# Patient Record
Sex: Male | Born: 1970 | Race: Black or African American | Hispanic: No | Marital: Married | Smoking: Former smoker
Health system: Southern US, Community
[De-identification: ages and names within clinical notes are randomized; demographics above are authoritative.]

## PROBLEM LIST (undated history)

## (undated) DIAGNOSIS — G7000 Myasthenia gravis without (acute) exacerbation: Secondary | ICD-10-CM

## (undated) DIAGNOSIS — M1990 Unspecified osteoarthritis, unspecified site: Secondary | ICD-10-CM

## (undated) DIAGNOSIS — J45909 Unspecified asthma, uncomplicated: Secondary | ICD-10-CM

## (undated) DIAGNOSIS — Z87898 Personal history of other specified conditions: Secondary | ICD-10-CM

## (undated) DIAGNOSIS — I499 Cardiac arrhythmia, unspecified: Secondary | ICD-10-CM

## (undated) DIAGNOSIS — I4891 Unspecified atrial fibrillation: Secondary | ICD-10-CM

## (undated) DIAGNOSIS — R7303 Prediabetes: Secondary | ICD-10-CM

## (undated) DIAGNOSIS — E079 Disorder of thyroid, unspecified: Secondary | ICD-10-CM

## (undated) HISTORY — PX: ABLATION: SHX5711

## (undated) HISTORY — DX: Unspecified asthma, uncomplicated: J45.909

## (undated) HISTORY — DX: Unspecified atrial fibrillation: I48.91

## (undated) HISTORY — PX: TRACHEAL SURGERY: SHX1096

## (undated) HISTORY — PX: WISDOM TOOTH EXTRACTION: SHX21

## (undated) HISTORY — PX: A-FLUTTER ABLATION: EP1230

## (undated) HISTORY — DX: Disorder of thyroid, unspecified: E07.9

---

## 2010-05-07 HISTORY — PX: TRACHEAL SURGERY: SHX1096

## 2010-12-06 DIAGNOSIS — G7000 Myasthenia gravis without (acute) exacerbation: Secondary | ICD-10-CM

## 2010-12-06 HISTORY — DX: Myasthenia gravis without (acute) exacerbation: G70.00

## 2011-02-19 DIAGNOSIS — G7000 Myasthenia gravis without (acute) exacerbation: Secondary | ICD-10-CM

## 2011-02-19 DIAGNOSIS — E0500 Thyrotoxicosis with diffuse goiter without thyrotoxic crisis or storm: Secondary | ICD-10-CM | POA: Insufficient documentation

## 2011-02-20 DIAGNOSIS — IMO0001 Reserved for inherently not codable concepts without codable children: Secondary | ICD-10-CM | POA: Insufficient documentation

## 2011-02-20 DIAGNOSIS — IMO0002 Reserved for concepts with insufficient information to code with codable children: Secondary | ICD-10-CM | POA: Insufficient documentation

## 2011-03-23 DIAGNOSIS — I4891 Unspecified atrial fibrillation: Secondary | ICD-10-CM | POA: Insufficient documentation

## 2011-12-17 DIAGNOSIS — IMO0002 Reserved for concepts with insufficient information to code with codable children: Secondary | ICD-10-CM | POA: Insufficient documentation

## 2011-12-17 DIAGNOSIS — Z0000 Encounter for general adult medical examination without abnormal findings: Secondary | ICD-10-CM | POA: Insufficient documentation

## 2016-10-24 MED FILL — HYDROCODON-APAP 10-325: 10-325 | 30 days supply | Qty: 60 | Fill #0

## 2016-11-07 ENCOUNTER — Encounter (HOSPITAL_COMMUNITY): Payer: Self-pay | Admitting: Emergency Medicine

## 2016-11-07 ENCOUNTER — Emergency Department (HOSPITAL_COMMUNITY)
Admission: EM | Admit: 2016-11-07 | Discharge: 2016-11-07 | Disposition: A | Discharge status: Home / Self Care | Payer: No Typology Code available for payment source | Attending: Emergency Medicine | Admitting: Emergency Medicine

## 2016-11-07 ENCOUNTER — Emergency Department (HOSPITAL_COMMUNITY): Payer: No Typology Code available for payment source

## 2016-11-07 DIAGNOSIS — S39012A Strain of muscle, fascia and tendon of lower back, initial encounter: Secondary | ICD-10-CM

## 2016-11-07 DIAGNOSIS — Y939 Activity, unspecified: Secondary | ICD-10-CM | POA: Insufficient documentation

## 2016-11-07 DIAGNOSIS — Y998 Other external cause status: Secondary | ICD-10-CM | POA: Insufficient documentation

## 2016-11-07 DIAGNOSIS — Y92410 Unspecified street and highway as the place of occurrence of the external cause: Secondary | ICD-10-CM | POA: Insufficient documentation

## 2016-11-07 DIAGNOSIS — S3992XA Unspecified injury of lower back, initial encounter: Secondary | ICD-10-CM | POA: Diagnosis present

## 2016-11-07 HISTORY — DX: Myasthenia gravis without (acute) exacerbation: G70.00

## 2016-11-07 MED ORDER — KETOROLAC TROMETHAMINE 30 MG/ML IJ SOLN: 30.0000 mg | Freq: Once | INTRAMUSCULAR | Status: DC

## 2016-11-07 NOTE — Discharge Instructions (Addendum)
Take ibuprofen and tylenol as needed for pain. Your x-ray of the back does not show broken bone or serious injury.  You will be very sore over the next several days. Symptoms can last for 1-2 weeks.  Return for worsening symptoms, including difficulty walking, confusion, new numbness/weakness or any other symptoms concerning to you.
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
## 2019-02-07 ENCOUNTER — Encounter (HOSPITAL_COMMUNITY): Payer: Self-pay

## 2019-02-07 ENCOUNTER — Ambulatory Visit (INDEPENDENT_AMBULATORY_CARE_PROVIDER_SITE_OTHER): Payer: Medicaid Other

## 2019-02-07 ENCOUNTER — Ambulatory Visit (HOSPITAL_COMMUNITY)
Admission: EM | Admit: 2019-02-07 | Discharge: 2019-02-07 | Disposition: A | Discharge status: Home / Self Care | Payer: Medicaid Other | Attending: Emergency Medicine | Admitting: Emergency Medicine

## 2019-02-07 ENCOUNTER — Other Ambulatory Visit: Payer: Self-pay

## 2019-02-07 DIAGNOSIS — Z20822 Contact with and (suspected) exposure to covid-19: Secondary | ICD-10-CM

## 2019-02-07 DIAGNOSIS — J22 Unspecified acute lower respiratory infection: Secondary | ICD-10-CM | POA: Diagnosis present

## 2019-02-07 DIAGNOSIS — U071 COVID-19: Secondary | ICD-10-CM | POA: Diagnosis not present

## 2019-02-07 DIAGNOSIS — Z20828 Contact with and (suspected) exposure to other viral communicable diseases: Secondary | ICD-10-CM

## 2019-02-07 DIAGNOSIS — J111 Influenza due to unidentified influenza virus with other respiratory manifestations: Secondary | ICD-10-CM

## 2019-02-07 MED ORDER — ACETAMINOPHEN 325 MG PO TABS
650.0000 mg | ORAL_TABLET | Freq: Once | ORAL | Status: AC
  Administered 2019-02-07: 650 mg via ORAL

## 2019-02-07 NOTE — ED Triage Notes (Signed)
Pt cc body ache, fever, and a headache.pt states he wants to be tested for covid.

## 2019-02-07 NOTE — Discharge Instructions (Signed)
Your chest xray is normal today which is reassuring.  As you are both ill, this does appear consistent and concerning for covid-19.  Unfortunately there isn't a specific treatment for this.  Your oxygen level and breathing look well today, which is reassuring.  Continue as you have, rest, fluids, tylenol as needed for pain or fevers.  Self isolate until covid results are back and negative.  Will notify you of any positive findings. You may monitor your results on your MyChart online as well.    Any worsening- increased work of breathing, worsening of chest pain , or otherwise worsening please go to the ER.  You may want to reach out to your neurology team on Monday to get further recommendations as well, especially if symptoms are persisting.

## 2019-02-07 NOTE — ED Provider Notes (Signed)
[facility]    CSN: [id number] Arrival date & time: 02/07/19  1545      History   Chief Complaint Chief Complaint  Patient presents with  . Fever    HPI Russell Green is a 48 y.o. male.   Russell Green presents with complaints of fever/chills, headaches, no taste or smell, body aches, cough, congestion. No sore throat or ear pain. Nausea. No vomiting or diarrhea. Symptoms started 1 week ago. Worsening. Not working, wife doesn't work either. She is here today as well with same symptoms and also feeling worse. No specific known ill contacts. Chest pain  And shortness of breath . Denies any previous similar. History  Of myasthenia crisis and respiratory failure with this. He is on long term prednisone as well as mestinon. Has been taking tylenol, took at 3p this afternoon. Does feel a little better after this. Does follow with a PCP and well as with neurology. Doesn't smoke.     ROS per HPI, negative if not otherwise mentioned.      Past Medical History:  Diagnosis Date  . Myasthenia gravis ([place]) .    There are no active problems to display for this patient.   History reviewed. No pertinent surgical history.     Home Medications    Prior to Admission medications   Medication Sig Start Date End Date Taking? Authorizing Provider  omeprazole (PRILOSEC) 20 MG capsule Take 20 mg by mouth daily.   Yes [provider]  oxyCODONE (OXYCONTIN) 10 mg 12 hr tablet Take 10 mg by mouth every 12 (twelve) hours.   Yes [provider]  predniSONE (DELTASONE) 10 MG tablet Take 30 mg by mouth daily with breakfast.   Yes [provider]  pyridostigmine (MESTINON) 60 MG tablet Take 60 mg by mouth 3 (three) times daily.   Yes [provider]    Family History No family history on file.  Social History Social History   Tobacco Use  . Smoking status: Never Smoker  . Smokeless tobacco: Never Used  Substance Use Topics  . Alcohol use: No  .  Drug use: No     Allergies   Patient has no known allergies.   Review of Systems Review of Systems   Physical Exam Triage Vital Signs ED Triage Vitals  Enc Vitals Group     BP 02/07/19 1639 136/83     Pulse Rate 02/07/19 1639 100     Resp 02/07/19 1639 18     Temp 02/07/19 1639 (!) 101.8 F (38.8 C)     Temp Source 02/07/19 1639 Oral     SpO2 02/07/19 1639 97 %     Weight 02/07/19 1635 260 lb (117.9 kg)     Height --      Head Circumference --      Peak Flow --      Pain Score 02/07/19 1635 8     Pain Loc --      Pain Edu? --      Excl. in [place]? --    No data found.  Updated Vital Signs BP 136/83 (BP Location: Right Arm)   Pulse 100   Temp (!) 101.8 F (38.8 C) (Oral)   Resp 18   Wt 260 lb (117.9 kg)   SpO2 97%   BMI 34.30 kg/m    Physical Exam Constitutional:      General: He is not in acute distress.    Appearance: He is well-developed. He is ill-appearing.  Cardiovascular:     Rate and Rhythm: Normal rate and regular rhythm.  Pulmonary:     Effort: Pulmonary effort is normal. No respiratory distress.     Breath sounds: Normal breath sounds. No wheezing.  Skin:    General: Skin is warm and dry.  Neurological:     Mental Status: He is alert and oriented to person, place, and time.      UC Treatments / Results  Labs (all labs ordered are listed, but only abnormal results are displayed) Labs Reviewed  NOVEL CORONAVIRUS, NAA (HOSP ORDER, SEND-OUT TO REF LAB; TAT 18-24 HRS)    EKG   Radiology Dg Chest 2 View  Result Date: 02/07/2019 CLINICAL DATA:  Fever, cough EXAM: CHEST - 2 VIEW COMPARISON:  None. FINDINGS: Cardiomegaly. Both lungs are clear. Pectus deformity. The visualized skeletal structures are otherwise unremarkable. IMPRESSION: Cardiomegaly without acute abnormality of the lungs. Electronically Signed   By: Eddie Candle M.D.   On: 02/07/2019 17:20    Procedures Procedures (including critical care time)  Medications Ordered in UC  Medications  acetaminophen (TYLENOL) tablet 650 mg (650 mg Oral Given 02/07/19 1701)    Initial Impression / Assessment and Plan / UC Course  I have reviewed the triage vital signs and the nursing notes.  Pertinent labs & imaging results that were available during my care of the patient were reviewed by me and considered in my medical decision making (see chart for details).     Fever noted. No tachypnea or hypoxia. No increased work of breathing noted. Wife with same symptoms and same time frame, both with normal chest xrays. Suspicious for viral illness, likely covid-19 during this pandemic. covid testing pending. Supportive cares discussed and recommended as well as isolation precautions. Strict er precautions discussed and emphasized in this patient with myasthenia gravis. Patient verbalized understanding and agreeable to plan.  Ambulatory out of clinic without difficulty.    Final Clinical Impressions(s) / UC Diagnoses   Final diagnoses:  Lower respiratory tract infection  Suspected COVID-19 virus infection     Discharge Instructions     Your chest xray is normal today which is reassuring.  As you are both ill, this does appear consistent and concerning for covid-19.  Unfortunately there isn't a specific treatment for this.  Your oxygen level and breathing look well today, which is reassuring.  Continue as you have, rest, fluids, tylenol as needed for pain or fevers.  Self isolate until covid results are back and negative.  Will notify you of any positive findings. You may monitor your results on your MyChart online as well.    Any worsening- increased work of breathing, worsening of chest pain , or otherwise worsening please go to the ER.  You may want to reach out to your neurology team on Monday to get further recommendations as well, especially if symptoms are persisting.     ED Prescriptions    None     PDMP not reviewed this encounter.   Zigmund Gottron, NP  02/07/19 1752

## 2019-02-09 ENCOUNTER — Telehealth (HOSPITAL_COMMUNITY): Payer: Self-pay | Admitting: Emergency Medicine

## 2019-02-09 ENCOUNTER — Other Ambulatory Visit: Payer: Self-pay

## 2019-02-09 ENCOUNTER — Emergency Department (HOSPITAL_COMMUNITY): Payer: HRSA Program

## 2019-02-09 ENCOUNTER — Encounter (HOSPITAL_COMMUNITY): Payer: Self-pay | Admitting: *Deleted

## 2019-02-09 ENCOUNTER — Inpatient Hospital Stay (HOSPITAL_COMMUNITY)
Admission: EM | Admit: 2019-02-09 | Discharge: 2019-02-13 | DRG: 177 | Disposition: A | Discharge status: Home / Self Care | Payer: HRSA Program | Attending: Internal Medicine | Admitting: Internal Medicine

## 2019-02-09 DIAGNOSIS — J1289 Other viral pneumonia: Secondary | ICD-10-CM | POA: Diagnosis present

## 2019-02-09 DIAGNOSIS — E860 Dehydration: Secondary | ICD-10-CM | POA: Diagnosis present

## 2019-02-09 DIAGNOSIS — N179 Acute kidney failure, unspecified: Secondary | ICD-10-CM

## 2019-02-09 DIAGNOSIS — K219 Gastro-esophageal reflux disease without esophagitis: Secondary | ICD-10-CM | POA: Diagnosis present

## 2019-02-09 DIAGNOSIS — U071 COVID-19: Secondary | ICD-10-CM | POA: Diagnosis present

## 2019-02-09 DIAGNOSIS — D696 Thrombocytopenia, unspecified: Secondary | ICD-10-CM | POA: Diagnosis present

## 2019-02-09 DIAGNOSIS — R5383 Other fatigue: Secondary | ICD-10-CM | POA: Diagnosis present

## 2019-02-09 DIAGNOSIS — G7000 Myasthenia gravis without (acute) exacerbation: Secondary | ICD-10-CM

## 2019-02-09 DIAGNOSIS — E669 Obesity, unspecified: Secondary | ICD-10-CM | POA: Diagnosis present

## 2019-02-09 DIAGNOSIS — Z6834 Body mass index (BMI) 34.0-34.9, adult: Secondary | ICD-10-CM | POA: Diagnosis not present

## 2019-02-09 DIAGNOSIS — D72819 Decreased white blood cell count, unspecified: Secondary | ICD-10-CM | POA: Diagnosis present

## 2019-02-09 LAB — CBC WITH DIFFERENTIAL/PLATELET
Abs Immature Granulocytes: 0.01 10*3/uL (ref 0.00–0.07)
Basophils Absolute: 0 10*3/uL (ref 0.0–0.1)
Basophils Relative: 0 %
Eosinophils Absolute: 0 10*3/uL (ref 0.0–0.5)
Eosinophils Relative: 0 %
HCT: 46.4 % (ref 39.0–52.0)
Hemoglobin: 15.4 g/dL (ref 13.0–17.0)
Immature Granulocytes: 0 %
Lymphocytes Relative: 22 %
Lymphs Abs: 0.8 10*3/uL (ref 0.7–4.0)
MCH: 30 pg (ref 26.0–34.0)
MCHC: 33.2 g/dL (ref 30.0–36.0)
MCV: 90.4 fL (ref 80.0–100.0)
Monocytes Absolute: 0.2 10*3/uL (ref 0.1–1.0)
Monocytes Relative: 4 %
Neutro Abs: 2.8 10*3/uL (ref 1.7–7.7)
Neutrophils Relative %: 74 %
Platelets: 126 10*3/uL — ABNORMAL LOW (ref 150–400)
RBC: 5.13 MIL/uL (ref 4.22–5.81)
RDW: 12.9 % (ref 11.5–15.5)
WBC: 3.8 10*3/uL — ABNORMAL LOW (ref 4.0–10.5)
nRBC: 0 % (ref 0.0–0.2)

## 2019-02-09 LAB — COMPREHENSIVE METABOLIC PANEL
ALT: 22 U/L (ref 0–44)
AST: 31 U/L (ref 15–41)
Albumin: 3.5 g/dL (ref 3.5–5.0)
Alkaline Phosphatase: 37 U/L — ABNORMAL LOW (ref 38–126)
Anion gap: 11 (ref 5–15)
BUN: 15 mg/dL (ref 6–20)
CO2: 28 mmol/L (ref 22–32)
Calcium: 8.2 mg/dL — ABNORMAL LOW (ref 8.9–10.3)
Chloride: 95 mmol/L — ABNORMAL LOW (ref 98–111)
Creatinine, Ser: 1.6 mg/dL — ABNORMAL HIGH (ref 0.61–1.24)
GFR calc Af Amer: 59 mL/min — ABNORMAL LOW (ref >60)
GFR calc non Af Amer: 51 mL/min — ABNORMAL LOW (ref >60)
Glucose, Bld: 97 mg/dL (ref 70–99)
Potassium: 3.8 mmol/L (ref 3.5–5.1)
Sodium: 134 mmol/L — ABNORMAL LOW (ref 135–145)
Total Bilirubin: 1 mg/dL (ref 0.3–1.2)
Total Protein: 6.4 g/dL — ABNORMAL LOW (ref 6.5–8.1)

## 2019-02-09 LAB — URINALYSIS, ROUTINE W REFLEX MICROSCOPIC
Bilirubin Urine: NEGATIVE
Glucose, UA: NEGATIVE mg/dL
Hgb urine dipstick: NEGATIVE
Ketones, ur: 5 mg/dL — AB
Leukocytes,Ua: NEGATIVE
Nitrite: NEGATIVE
Protein, ur: NEGATIVE mg/dL
Specific Gravity, Urine: 1.021 (ref 1.005–1.030)
pH: 6 (ref 5.0–8.0)

## 2019-02-09 LAB — C-REACTIVE PROTEIN: CRP: 9.7 mg/dL — ABNORMAL HIGH (ref <1.0)

## 2019-02-09 LAB — PROTIME-INR
INR: 1.1 (ref 0.8–1.2)
Prothrombin Time: 14.4 seconds (ref 11.4–15.2)

## 2019-02-09 LAB — LACTIC ACID, PLASMA
Lactic Acid, Venous: 1.1 mmol/L (ref 0.5–1.9)
Lactic Acid, Venous: 1.6 mmol/L (ref 0.5–1.9)

## 2019-02-09 LAB — FERRITIN: Ferritin: 337 ng/mL — ABNORMAL HIGH (ref 24–336)

## 2019-02-09 LAB — PROCALCITONIN: Procalcitonin: <0.1 ng/mL

## 2019-02-09 MED ORDER — METHYLPREDNISOLONE SODIUM SUCC 125 MG IJ SOLR
60.0000 mg | Freq: Two times a day (BID) | INTRAMUSCULAR | Status: DC
  Administered 2019-02-10 – 2019-02-13 (×8): 60 mg via INTRAVENOUS
  Filled 2019-02-09 (×8): qty 2

## 2019-02-09 MED ORDER — ENOXAPARIN SODIUM 60 MG/0.6ML ~~LOC~~ SOLN
60.0000 mg | Freq: Every day | SUBCUTANEOUS | Status: DC
  Administered 2019-02-10 – 2019-02-12 (×4): 60 mg via SUBCUTANEOUS
  Filled 2019-02-09 (×4): qty 0.6

## 2019-02-09 MED ORDER — SODIUM CHLORIDE 0.9 % IV BOLUS
1000.0000 mL | Freq: Once | INTRAVENOUS | Status: AC
  Administered 2019-02-09: 18:00:00 1000 mL via INTRAVENOUS

## 2019-02-09 MED ORDER — PANTOPRAZOLE SODIUM 40 MG PO TBEC
40.0000 mg | DELAYED_RELEASE_TABLET | Freq: Every day | ORAL | Status: DC
  Administered 2019-02-10 – 2019-02-13 (×5): 40 mg via ORAL
  Filled 2019-02-09 (×5): qty 1

## 2019-02-09 MED ORDER — SODIUM CHLORIDE 0.9 % IV SOLN
100.0000 mg | INTRAVENOUS | Status: AC
  Administered 2019-02-10 – 2019-02-13 (×4): 100 mg via INTRAVENOUS
  Filled 2019-02-09 (×4): qty 20

## 2019-02-09 MED ORDER — HEPARIN SODIUM (PORCINE) 5000 UNIT/ML IJ SOLN: 5000.0000 [IU] | Freq: Three times a day (TID) | INTRAMUSCULAR | Status: DC

## 2019-02-09 MED ORDER — SODIUM CHLORIDE 0.9 % IV SOLN
200.0000 mg | Freq: Once | INTRAVENOUS | Status: AC
  Administered 2019-02-10: 01:00:00 200 mg via INTRAVENOUS
  Filled 2019-02-09: qty 40

## 2019-02-09 MED ORDER — ACETAMINOPHEN 325 MG PO TABS
650.0000 mg | ORAL_TABLET | Freq: Four times a day (QID) | ORAL | Status: DC | PRN
  Administered 2019-02-11: 650 mg via ORAL
  Filled 2019-02-09: qty 2

## 2019-02-09 MED ORDER — ACETAMINOPHEN 500 MG PO TABS
1000.0000 mg | ORAL_TABLET | Freq: Once | ORAL | Status: AC
  Administered 2019-02-09: 18:00:00 1000 mg via ORAL
  Filled 2019-02-09: qty 2

## 2019-02-09 MED ORDER — PYRIDOSTIGMINE BROMIDE 60 MG PO TABS
60.0000 mg | ORAL_TABLET | Freq: Three times a day (TID) | ORAL | Status: DC
  Administered 2019-02-10 – 2019-02-13 (×11): 60 mg via ORAL
  Filled 2019-02-09 (×15): qty 1

## 2019-02-09 NOTE — H&P (Signed)
History and Physical    Ah Bott TKZ:[id number] DOB: 1970/06/06 DOA: 02/09/2019  PCP: Rogers Blocker, MD   Patient coming from: Home    Chief Complaint: Fever, body aches, headache  HPI: Russell Green is a 48 y.o. male with medical history significant of myasthenia gravis who presented to the emergency department today after he knew that he was tested positive for COVID and was also having high-grade fever, severe body ache and headache at home. Patient lives with his wife.  He did not give any history of travel or sick contacts.  About a week ago he started having myalgia, headache fever.  He lost his appetite.  He came to the emergency department in October 3 and was tested for COVID .  He was sent home from the ED.  Today he was called that he was tested positive for COVID.  Since his fever was not going anywhere, and he was very weak he decided come to the emergency department today. Patient seen and examined the bedside in the emergency department.  He still had high-grade fever.  He was saturating 93-94% on room air.  Denies any chest pain, shortness of breath, palpitation, nausea, vomiting, abdominal pain.  He reported severe loss of appetite. Patient has history of myasthenia gravis and follows with his PCP.  He is on Mestinon and prednisone and he says his myasthenia gravis is particularly well controlled.  ED Course: Chest x-ray showed subtle opacities within the LEFT lower lung and streaky opacities at the RIGHT lung base, suspicious for multifocal pneumonia.  He was given a bolus of normal saline.  Blood cultures have been sent.  Started on Solu-Medrol and put pharmacy consult to start Remdesivir.    Review of Systems: As per HPI otherwise 10 point review of systems negative.    Past Medical History:  Diagnosis Date   Myasthenia gravis ([place]) .    History reviewed. No pertinent surgical history.   reports that he has never smoked. He has never used smokeless tobacco. He  reports that he does not drink alcohol or use drugs.  No Known Allergies  No family history on file.   Prior to Admission medications   Medication Sig Start Date End Date Taking? Authorizing Provider  acetaminophen (TYLENOL) 325 MG tablet Take 650 mg by mouth every 6 (six) hours as needed for mild pain, fever or headache.   Yes [provider]  omeprazole (PRILOSEC) 20 MG capsule Take 20 mg by mouth daily.   Yes [provider]  Oxycodone HCl 10 MG TABS Take 10 mg by mouth 2 (two) times daily.   Yes [provider]  predniSONE (DELTASONE) 10 MG tablet Take 30 mg by mouth daily with breakfast.   Yes [provider]  pyridostigmine (MESTINON) 60 MG tablet Take 60 mg by mouth 3 (three) times daily.   Yes [provider]    Physical Exam: Vitals:   02/09/19 1656 02/09/19 1657 02/09/19 1701 02/09/19 1742  BP: 114/72 114/72  112/67  Pulse:  97  (!) 104  Resp: (!) 21 16  (!) 27  Temp:  (!) 102.4 F (39.1 C) (!) 103.5 F (39.7 C)   TempSrc:  Oral Rectal   SpO2:  93%  95%  Weight:   117.9 kg   Height:   6' 1"  (1.854 m)     Constitutional: Generalized weakness, ill looking Vitals:   02/09/19 1656 02/09/19 1657 02/09/19 1701 02/09/19 1742  BP: 114/72 114/72  112/67  Pulse:  97  (!) 104  Resp: (!) 21 16  (!) 27  Temp:  (!) 102.4 F (39.1 C) (!) 103.5 F (39.7 C)   TempSrc:  Oral Rectal   SpO2:  93%  95%  Weight:   117.9 kg   Height:   6' 1"  (1.854 m)    Respiratory: clear to auscultation bilaterally, no wheezing, no crackles. Normal respiratory effort. No accessory muscle use.  Cardiovascular: Regular rate and rhythm, no murmurs / rubs / gallops. No extremity edema.  Abdomen: no tenderness, soft,ND.   Skin: tatoos,no rashes, lesions, ulcers. No induration   Foley Catheter:None  Labs on Admission: I have personally reviewed following labs and imaging studies  CBC: Recent Labs  Lab 02/09/19 1549  WBC 3.8*  NEUTROABS 2.8  HGB  15.4  HCT 46.4  MCV 90.4  PLT 914*   Basic Metabolic Panel: Recent Labs  Lab 02/09/19 1549  NA 134*  K 3.8  CL 95*  CO2 28  GLUCOSE 97  BUN 15  CREATININE 1.60*  CALCIUM 8.2*   GFR: Estimated Creatinine Clearance: 76.8 mL/min (A) (by C-G formula based on SCr of 1.6 mg/dL (H)). Liver Function Tests: Recent Labs  Lab 02/09/19 1549  AST 31  ALT 22  ALKPHOS 37*  BILITOT 1.0  PROT 6.4*  ALBUMIN 3.5   No results for input(s): LIPASE, AMYLASE in the last 168 hours. No results for input(s): AMMONIA in the last 168 hours. Coagulation Profile: Recent Labs  Lab 02/09/19 1549  INR 1.1   Cardiac Enzymes: No results for input(s): CKTOTAL, CKMB, CKMBINDEX, TROPONINI in the last 168 hours. BNP (last 3 results) No results for input(s): PROBNP in the last 8760 hours. HbA1C: No results for input(s): HGBA1C in the last 72 hours. CBG: No results for input(s): GLUCAP in the last 168 hours. Lipid Profile: No results for input(s): CHOL, HDL, LDLCALC, TRIG, CHOLHDL, LDLDIRECT in the last 72 hours. Thyroid Function Tests: No results for input(s): TSH, T4TOTAL, FREET4, T3FREE, THYROIDAB in the last 72 hours. Anemia Panel: No results for input(s): VITAMINB12, FOLATE, FERRITIN, TIBC, IRON, RETICCTPCT in the last 72 hours. Urine analysis:    Component Value Date/Time   COLORURINE YELLOW 02/09/2019 1656   APPEARANCEUR CLEAR 02/09/2019 1656   LABSPEC 1.021 02/09/2019 1656   PHURINE 6.0 02/09/2019 1656   GLUCOSEU NEGATIVE 02/09/2019 1656   HGBUR NEGATIVE 02/09/2019 1656   BILIRUBINUR NEGATIVE 02/09/2019 1656   KETONESUR 5 (A) 02/09/2019 1656   PROTEINUR NEGATIVE 02/09/2019 1656   NITRITE NEGATIVE 02/09/2019 1656   LEUKOCYTESUR NEGATIVE 02/09/2019 1656    Radiological Exams on Admission: Dg Chest Port 1 View  Result Date: 02/09/2019 CLINICAL DATA:  Shortness of breath. COVID-19 positive last Friday. Febrile. EXAM: PORTABLE CHEST 1 VIEW COMPARISON:  Chest x-rays dated 02/07/2019.  FINDINGS: Stable cardiomegaly. Subtle opacities within the LEFT lower lung and streaky opacities at the RIGHT lung base, suspicious for multifocal pneumonia. No pleural effusion or pneumothorax seen. Osseous structures about the chest are unremarkable. IMPRESSION: 1. Subtle opacities within the LEFT lower lung and streaky opacities at the RIGHT lung base, suspicious for multifocal pneumonia. 2. Stable cardiomegaly. Electronically Signed   By: Franki Cabot M.D.   On: 02/09/2019 17:06     Assessment/Plan Principal Problem:   COVID-19 Active Problems:   AKI (acute kidney injury) ([place])   MG (myasthenia gravis) ([place])   COVID-19 pneumonia: Presented with fever, myalgia, headache.  Saturating fine on room air.  Chest x-ray showed multifocal pneumonia. We  will start on Solu-Medrol and remdesivir. Will consider giving him  Actemra if he becomes hypoxic.  Will check procalcitonin, d-dimer, ferritin, CRP. Tylenol for fever and headache Patient will be transferred to [facility].  AKI: Creatinine normal as per report on 06/09/2018.  Most likely this is from dehydration.  He was given a liter of normal saline in the emergency department.  I will hold on starting on scheduled IV fluids.  Will check BMP tomorrow.  Myasthenia gravis: On Mestinon and prednisone at home.  Currently stable.  Will resume Mestinon  Leucopenia/Thrombocytopenia:Mild.Likely associated with acute viral illness. WBC/Platelets were normal on reviewing previous labs. We will continue to monitor.  Severity of Illness: The appropriate patient status for this patient is INPATIENT.   DVT prophylaxis: Lovenox Code Status: Full code Family Communication: Called wife twice,phone not received Consults called: None     Shelly Coss MD [facility] Pager [phone]  If 7PM-7AM, please contact night-coverage [email or web address] Password [email or web address]  02/09/2019, 6:18 PM

## 2019-02-09 NOTE — ED Notes (Signed)
Phil ([facility]/Transport to [facility] ([place] called @ 1820-per Lonn Georgia, RN called by Levada Dy

## 2019-02-09 NOTE — ED Provider Notes (Signed)
[facility] EMERGENCY DEPARTMENT Provider Note   CSN: [id number] Arrival date & time: 02/09/19  1450     History   Chief Complaint Chief Complaint  Patient presents with  . Fatigue    covid +    HPI Russell Green is a 48 y.o. male.     48 year old male with prior medical history as detailed below presents for evaluation of COVID symptoms.  Patient reports that he was diagnosed with COVID based on testing on the October 3.  Patient reports that he has been symptomatic for the last 5 to 7 days.  Symptoms include fever, chills, cough, congestion, malaise, and fatigue.  He presents today with complaint of continued symptoms and worsening fatigue.  Of note, patient has a prior history of myasthenia gravis.  He is currently on Mestinon and prednisone.  He reports a prior history of respiratory failure requiring trach.  He currently reports that his myasthenia is under good control.  The history is provided by the patient and medical records.  Illness Location:  Covid positive Onset quality:  Gradual Duration:  1 week Timing:  Constant Progression:  Worsening Chronicity:  New Associated symptoms: fatigue and fever     Past Medical History:  Diagnosis Date  . Myasthenia gravis ([place]) .    There are no active problems to display for this patient.   History reviewed. No pertinent surgical history.      Home Medications    Prior to Admission medications   Medication Sig Start Date End Date Taking? Authorizing Provider  acetaminophen (TYLENOL) 325 MG tablet Take 650 mg by mouth every 6 (six) hours as needed for mild pain, fever or headache.   Yes [provider]  omeprazole (PRILOSEC) 20 MG capsule Take 20 mg by mouth daily.   Yes [provider]  Oxycodone HCl 10 MG TABS Take 10 mg by mouth 2 (two) times daily.   Yes [provider]  predniSONE (DELTASONE) 10 MG tablet Take 30 mg by mouth daily with breakfast.   Yes [provider]  pyridostigmine (MESTINON) 60 MG tablet Take 60 mg by mouth 3 (three) times daily.   Yes [provider]    Family History No family history on file.  Social History Social History   Tobacco Use  . Smoking status: Never Smoker  . Smokeless tobacco: Never Used  Substance Use Topics  . Alcohol use: No  . Drug use: No     Allergies   Patient has no known allergies.   Review of Systems Review of Systems  Constitutional: Positive for fatigue and fever.  All other systems reviewed and are negative.    Physical Exam Updated Vital Signs BP 123/72 (BP Location: Right Arm)   Pulse (!) 118   Temp (!) 102.5 F (39.2 C) (Oral)   Resp 20   SpO2 97%   Physical Exam Vitals signs and nursing note reviewed.  Constitutional:      General: He is not in acute distress.    Appearance: He is well-developed.     Comments: Ill appearing   HENT:     Head: Normocephalic and atraumatic.  Eyes:     Conjunctiva/sclera: Conjunctivae normal.     Pupils: Pupils are equal, round, and reactive to light.  Neck:     Musculoskeletal: Normal range of motion and neck supple.  Cardiovascular:     Rate and Rhythm: Normal rate and regular rhythm.     Heart sounds: Normal heart  sounds.  Pulmonary:     Effort: Pulmonary effort is normal. No respiratory distress.     Breath sounds: Normal breath sounds.  Abdominal:     General: There is no distension.     Palpations: Abdomen is soft.     Tenderness: There is no abdominal tenderness.  Musculoskeletal: Normal range of motion.        General: No deformity.  Skin:    General: Skin is warm and dry.  Neurological:     Mental Status: He is alert and oriented to person, place, and time.      ED Treatments / Results  Labs (all labs ordered are listed, but only abnormal results are displayed) Labs Reviewed  COMPREHENSIVE METABOLIC PANEL - Abnormal; Notable for the following components:      Result Value   Sodium 134 (*)     Chloride 95 (*)    Creatinine, Ser 1.60 (*)    Calcium 8.2 (*)    Total Protein 6.4 (*)    Alkaline Phosphatase 37 (*)    GFR calc non Af Amer 51 (*)    GFR calc Af Amer 59 (*)    All other components within normal limits  CBC WITH DIFFERENTIAL/PLATELET - Abnormal; Notable for the following components:   WBC 3.8 (*)    Platelets 126 (*)    All other components within normal limits  URINALYSIS, ROUTINE W REFLEX MICROSCOPIC - Abnormal; Notable for the following components:   Ketones, ur 5 (*)    All other components within normal limits  CULTURE, BLOOD (ROUTINE X 2)  CULTURE, BLOOD (ROUTINE X 2)  LACTIC ACID, PLASMA  PROTIME-INR  LACTIC ACID, PLASMA  HIV ANTIBODY (ROUTINE TESTING W REFLEX)  HIV4GL SAVE TUBE  CBC  COMPREHENSIVE METABOLIC PANEL    EKG None  Radiology Dg Chest Port 1 View  Result Date: 02/09/2019 CLINICAL DATA:  Shortness of breath. COVID-19 positive last Friday. Febrile. EXAM: PORTABLE CHEST 1 VIEW COMPARISON:  Chest x-rays dated 02/07/2019. FINDINGS: Stable cardiomegaly. Subtle opacities within the LEFT lower lung and streaky opacities at the RIGHT lung base, suspicious for multifocal pneumonia. No pleural effusion or pneumothorax seen. Osseous structures about the chest are unremarkable. IMPRESSION: 1. Subtle opacities within the LEFT lower lung and streaky opacities at the RIGHT lung base, suspicious for multifocal pneumonia. 2. Stable cardiomegaly. Electronically Signed   By: Franki Cabot M.D.   On: 02/09/2019 17:06    Procedures Procedures (including critical care time)  Medications Ordered in ED Medications  sodium chloride 0.9 % bolus 1,000 mL (has no administration in time range)  acetaminophen (TYLENOL) tablet 1,000 mg (has no administration in time range)     Initial Impression / Assessment and Plan / ED Course  I have reviewed the triage vital signs and the nursing notes.  Pertinent labs & imaging results that were available during my care  of the patient were reviewed by me and considered in my medical decision making (see chart for details).        MDM  Screen complete  Russell Green was evaluated in Emergency Department on 02/09/2019 for the symptoms described in the history of present illness. He was evaluated in the context of the global COVID-19 pandemic, which necessitated consideration that the patient might be at risk for infection with the SARS-CoV-2 virus that causes COVID-19. Institutional protocols and algorithms that pertain to the evaluation of patients at risk for COVID-19 are in a state of rapid change based on information released  by regulatory bodies including the CDC and federal and state organizations. These policies and algorithms were followed during the patient's care in the ED.   Patient presents for evaluation of worsening symptoms secondary to COVID 19.   Patient would benefit from admission - he meets criteria for Remdesivir and steroids.  Hospitalist service is aware of case and will evaluate for admission.       Final Clinical Impressions(s) / ED Diagnoses   Final diagnoses:  [id number]    ED Discharge Orders    None       Valarie Merino, MD 02/09/19 [phone]

## 2019-02-09 NOTE — ED Notes (Signed)
Dinner tray delivered.

## 2019-02-09 NOTE — ED Notes (Signed)
Dinner Tray Ordered @ [phone].

## 2019-02-09 NOTE — ED Triage Notes (Signed)
Pt states he has had covid x 5 days.  States he feels terrible and so weak he cannot eat or drink.  Took "fever reducer" earlier today.  Temp 102.5.

## 2019-02-09 NOTE — Progress Notes (Signed)
Pharmacy Brief Note   O:  ALT: 22  CXR: Subtle opacities within the LEFT lower lung and streaky opacities at the RIGHT lung base, suspicious for multifocal pneumonia.  SpO2: 91% on room air    A/P:  Patient meets requirements for remdesivir therapy .  Will start  remdesivir 200 mg IV x 1  followed by 100 mg IV daily x 4 days.  Monitor ALT  Royetta Asal, PharmD, BCPS 02/09/2019 11:32 PM

## 2019-02-09 NOTE — Telephone Encounter (Signed)
Called patient number, wife answered and stated he is getting worse and very sick. Pt wife instructed to take him to the ER right away for evaluation. Called ER to notify them.

## 2019-02-09 NOTE — ED Notes (Signed)
ED TO INPATIENT HANDOFF REPORT  ED Nurse Name and Phone #:  Lonn Georgia [phone]  S Name/Age/Gender Russell Green 48 y.o. male Room/Bed: 041C/041C  Code Status   Code Status: Full Code  Home/SNF/Other Home Patient oriented to: self, place, time and situation Is this baseline? Yes   Triage Complete: Triage complete  Chief Complaint Covid positive, fatigue, symptoms  Triage Note Pt states he has had covid x 5 days.  States he feels terrible and so weak he cannot eat or drink.  Took "fever reducer" earlier today.  Temp 102.5.   Allergies No Known Allergies  Level of Care/Admitting Diagnosis ED Disposition    ED Disposition Condition [facility] Area: [place] [[id number]]  Level of Care: Med-Surg [16]  Covid Evaluation: Confirmed COVID Positive  Diagnosis: COVID-19 [[id number]]  Admitting Physician: Shelly Coss [[id number]]  Attending Physician: Shelly Coss [[id number]]  Estimated length of stay: past midnight tomorrow  Certification:: I certify this patient will need inpatient services for at least 2 midnights  PT Class (Do Not Modify): Inpatient [101]  PT Acc Code (Do Not Modify): Private [1]       B Medical/Surgery History Past Medical History:  Diagnosis Date  . Myasthenia gravis ([place]) .   History reviewed. No pertinent surgical history.   A IV Location/Drains/Wounds Patient Lines/Drains/Airways Status   Active Line/Drains/Airways    Name:   Placement date:   Placement time:   Site:   Days:   Peripheral IV 02/09/19 Left Forearm   02/09/19    1740    Forearm   less than 1          Intake/Output Last 24 hours No intake or output data in the 24 hours ending 02/09/19 2205  Labs/Imaging Results for orders placed or performed during the hospital encounter of 02/09/19 (from the past 48 hour(s))  Comprehensive metabolic panel     Status: Abnormal   Collection Time: 02/09/19  3:49 PM  Result Value Ref Range   Sodium 134 (L) 135  - 145 mmol/L   Potassium 3.8 3.5 - 5.1 mmol/L   Chloride 95 (L) 98 - 111 mmol/L   CO2 28 22 - 32 mmol/L   Glucose, Bld 97 70 - 99 mg/dL   BUN 15 6 - 20 mg/dL   Creatinine, Ser 1.60 (H) 0.61 - 1.24 mg/dL   Calcium 8.2 (L) 8.9 - 10.3 mg/dL   Total Protein 6.4 (L) 6.5 - 8.1 g/dL   Albumin 3.5 3.5 - 5.0 g/dL   AST 31 15 - 41 U/L   ALT 22 0 - 44 U/L   Alkaline Phosphatase 37 (L) 38 - 126 U/L   Total Bilirubin 1.0 0.3 - 1.2 mg/dL   GFR calc non Af Amer 51 (L) >60 mL/min   GFR calc Af Amer 59 (L) >60 mL/min   Anion gap 11 5 - 15    Comment: Performed at [facility], [address], [place], [place] [address]  Lactic acid, plasma     Status: None   Collection Time: 02/09/19  3:49 PM  Result Value Ref Range   Lactic Acid, Venous 1.1 0.5 - 1.9 mmol/L    Comment: Performed at [facility] [address], [place], [place] [address]  CBC with Differential     Status: Abnormal   Collection Time: 02/09/19  3:49 PM  Result Value Ref Range   WBC 3.8 (L) 4.0 - 10.5 K/uL   RBC  5.13 4.22 - 5.81 MIL/uL   Hemoglobin 15.4 13.0 - 17.0 g/dL   HCT 46.4 39.0 - 52.0 %   MCV 90.4 80.0 - 100.0 fL   MCH 30.0 26.0 - 34.0 pg   MCHC 33.2 30.0 - 36.0 g/dL   RDW 12.9 11.5 - 15.5 %   Platelets 126 (L) 150 - 400 K/uL    Comment: REPEATED TO VERIFY   nRBC 0.0 0.0 - 0.2 %   Neutrophils Relative % 74 %   Neutro Abs 2.8 1.7 - 7.7 K/uL   Lymphocytes Relative 22 %   Lymphs Abs 0.8 0.7 - 4.0 K/uL   Monocytes Relative 4 %   Monocytes Absolute 0.2 0.1 - 1.0 K/uL   Eosinophils Relative 0 %   Eosinophils Absolute 0.0 0.0 - 0.5 K/uL   Basophils Relative 0 %   Basophils Absolute 0.0 0.0 - 0.1 K/uL   Immature Granulocytes 0 %   Abs Immature Granulocytes 0.01 0.00 - 0.07 K/uL    Comment: Performed at [facility] [address], [place], [place] [address]  Protime-INR     Status: None   Collection Time: 02/09/19  3:49 PM  Result Value Ref Range   Prothrombin Time 14.4 11.4 - 15.2 seconds   INR  1.1 0.8 - 1.2    Comment: (NOTE) INR goal varies based on device and disease states. Performed at [facility], [place] [address] [place], [place] [address]   Urinalysis, Routine w reflex microscopic     Status: Abnormal   Collection Time: 02/09/19  4:56 PM  Result Value Ref Range   Color, Urine YELLOW YELLOW   APPearance CLEAR CLEAR   Specific Gravity, Urine 1.021 1.005 - 1.030   pH 6.0 5.0 - 8.0   Glucose, UA NEGATIVE NEGATIVE mg/dL   Hgb urine dipstick NEGATIVE NEGATIVE   Bilirubin Urine NEGATIVE NEGATIVE   Ketones, ur 5 (A) NEGATIVE mg/dL   Protein, ur NEGATIVE NEGATIVE mg/dL   Nitrite NEGATIVE NEGATIVE   Leukocytes,Ua NEGATIVE NEGATIVE    Comment: Performed at [facility] [address], [place], [place] [address]  Lactic acid, plasma     Status: None   Collection Time: 02/09/19  6:20 PM  Result Value Ref Range   Lactic Acid, Venous 1.6 0.5 - 1.9 mmol/L    Comment: Performed at [facility] [address], [place], [place] [address]  C-reactive protein     Status: Abnormal   Collection Time: 02/09/19  6:20 PM  Result Value Ref Range   CRP 9.7 (H) <1.0 mg/dL    Comment: Performed at [facility] [address], [place], [place] [address]  Ferritin     Status: Abnormal   Collection Time: 02/09/19  6:20 PM  Result Value Ref Range   Ferritin 337 (H) 24 - 336 ng/mL    Comment: Performed at [facility] [address] [place], [place] [address]  Procalcitonin - Baseline     Status: None   Collection Time: 02/09/19  6:20 PM  Result Value Ref Range   Procalcitonin <0.10 ng/mL    Comment:        Interpretation: PCT (Procalcitonin) <= 0.5 ng/mL: Systemic infection (sepsis) is not likely. Local bacterial infection is possible. (NOTE)       Sepsis PCT Algorithm           Lower Respiratory Tract  Infection PCT Algorithm    ----------------------------     ----------------------------         PCT < 0.25  ng/mL                PCT < 0.10 ng/mL         Strongly encourage             Strongly discourage   discontinuation of antibiotics    initiation of antibiotics    ----------------------------     -----------------------------       PCT 0.25 - 0.50 ng/mL            PCT 0.10 - 0.25 ng/mL               OR       >80% decrease in PCT            Discourage initiation of                                            antibiotics      Encourage discontinuation           of antibiotics    ----------------------------     -----------------------------         PCT >= 0.50 ng/mL              PCT 0.26 - 0.50 ng/mL               AND        <80% decrease in PCT             Encourage initiation of                                             antibiotics       Encourage continuation           of antibiotics    ----------------------------     -----------------------------        PCT >= 0.50 ng/mL                  PCT > 0.50 ng/mL               AND         increase in PCT                  Strongly encourage                                      initiation of antibiotics    Strongly encourage escalation           of antibiotics                                     -----------------------------                                           PCT <= 0.25 ng/mL  OR                                        > 80% decrease in PCT                                     Discontinue / Do not initiate                                             antibiotics Performed at [facility], [place] [address], [place], [place] [address]    Dg Chest Port 1 View  Result Date: 02/09/2019 CLINICAL DATA:  Shortness of breath. COVID-19 positive last Friday. Febrile. EXAM: PORTABLE CHEST 1 VIEW COMPARISON:  Chest x-rays dated 02/07/2019. FINDINGS: Stable cardiomegaly. Subtle opacities within the LEFT lower lung and streaky opacities at the RIGHT lung base, suspicious for multifocal pneumonia.  No pleural effusion or pneumothorax seen. Osseous structures about the chest are unremarkable. IMPRESSION: 1. Subtle opacities within the LEFT lower lung and streaky opacities at the RIGHT lung base, suspicious for multifocal pneumonia. 2. Stable cardiomegaly. Electronically Signed   By: Franki Cabot M.D.   On: 02/09/2019 17:06    Pending Labs Unresulted Labs (From admission, onward)    Start     Ordered   02/10/19 0500  CBC  Tomorrow morning,   R     02/09/19 1738   02/10/19 0500  Comprehensive metabolic panel  Tomorrow morning,   R     02/09/19 1738   02/10/19 0500  Procalcitonin  Daily,   R     02/09/19 1819   02/09/19 1741  C-reactive protein  Daily,   R     02/09/19 1740   02/09/19 1741  D-dimer, quantitative (not at [facility])  Daily,   R     02/09/19 1740   02/09/19 1741  Ferritin  Daily,   R     02/09/19 1740   02/09/19 1741  Type and screen [facility]  Once,   STAT    Comments: [facility]    02/09/19 1740   02/09/19 1535  Culture, blood (Routine x 2)  BLOOD CULTURE X 2,   STAT     02/09/19 1534          Vitals/Pain Today's Vitals   02/09/19 1930 02/09/19 2045 02/09/19 2137 02/09/19 2200  BP: 104/79  104/63   Pulse: 100  95   Resp: 20  (!) 21   Temp:      TempSrc:      SpO2: 92% 94% 92% 95%  Weight:      Height:      PainSc:        Isolation Precautions No active isolations  Medications Medications  pyridostigmine (MESTINON) tablet 60 mg (has no administration in time range)  pantoprazole (PROTONIX) EC tablet 40 mg (has no administration in time range)  methylPREDNISolone sodium succinate (SOLU-MEDROL) 125 mg/2 mL injection 60 mg (has no administration in time range)  acetaminophen (TYLENOL) tablet 650 mg (has no administration in time range)  enoxaparin (LOVENOX) injection 40 mg (has no administration in time range)  sodium chloride 0.9 % bolus 1,000 mL (1,000 mLs  Intravenous New Bag/Given 02/09/19 1742)  acetaminophen  (TYLENOL) tablet 1,000 mg (1,000 mg Oral Given 02/09/19 1741)    Mobility walks     Focused Assessments Pulmonary Assessment Handoff:  Lung sounds:   O2 Device: Room Air        R Recommendations: See Admitting Provider Note  Report given to: Manuela Schwartz RN  Additional Notes:

## 2019-02-09 NOTE — ED Notes (Signed)
Pt leaving via [facility]AR.

## 2019-02-10 ENCOUNTER — Other Ambulatory Visit: Payer: Self-pay

## 2019-02-10 LAB — COMPREHENSIVE METABOLIC PANEL
ALT: 21 U/L (ref 0–44)
AST: 30 U/L (ref 15–41)
Albumin: 3.5 g/dL (ref 3.5–5.0)
Alkaline Phosphatase: 38 U/L (ref 38–126)
Anion gap: 11 (ref 5–15)
BUN: 15 mg/dL (ref 6–20)
CO2: 25 mmol/L (ref 22–32)
Calcium: 8 mg/dL — ABNORMAL LOW (ref 8.9–10.3)
Chloride: 101 mmol/L (ref 98–111)
Creatinine, Ser: 1.29 mg/dL — ABNORMAL HIGH (ref 0.61–1.24)
GFR calc Af Amer: >60 mL/min (ref >60)
GFR calc non Af Amer: >60 mL/min (ref >60)
Glucose, Bld: 101 mg/dL — ABNORMAL HIGH (ref 70–99)
Potassium: 4.1 mmol/L (ref 3.5–5.1)
Sodium: 137 mmol/L (ref 135–145)
Total Bilirubin: 0.6 mg/dL (ref 0.3–1.2)
Total Protein: 6.6 g/dL (ref 6.5–8.1)

## 2019-02-10 LAB — CBC
HCT: 46.5 % (ref 39.0–52.0)
Hemoglobin: 15.1 g/dL (ref 13.0–17.0)
MCH: 29.3 pg (ref 26.0–34.0)
MCHC: 32.5 g/dL (ref 30.0–36.0)
MCV: 90.1 fL (ref 80.0–100.0)
Platelets: 130 10*3/uL — ABNORMAL LOW (ref 150–400)
RBC: 5.16 MIL/uL (ref 4.22–5.81)
RDW: 13 % (ref 11.5–15.5)
WBC: 4.5 10*3/uL (ref 4.0–10.5)
nRBC: 0 % (ref 0.0–0.2)

## 2019-02-10 LAB — FERRITIN: Ferritin: 328 ng/mL (ref 24–336)

## 2019-02-10 LAB — NOVEL CORONAVIRUS, NAA (HOSP ORDER, SEND-OUT TO REF LAB; TAT 18-24 HRS): SARS-CoV-2, NAA: DETECTED — AB

## 2019-02-10 LAB — BRAIN NATRIURETIC PEPTIDE: B Natriuretic Peptide: 23.1 pg/mL (ref 0.0–100.0)

## 2019-02-10 LAB — D-DIMER, QUANTITATIVE: D-Dimer, Quant: 0.69 ug/mL-FEU — ABNORMAL HIGH (ref 0.00–0.50)

## 2019-02-10 LAB — MAGNESIUM: Magnesium: 1.9 mg/dL (ref 1.7–2.4)

## 2019-02-10 LAB — PROCALCITONIN: Procalcitonin: <0.1 ng/mL

## 2019-02-10 LAB — C-REACTIVE PROTEIN: CRP: 14.7 mg/dL — ABNORMAL HIGH (ref <1.0)

## 2019-02-10 MED ORDER — ONDANSETRON HCL 4 MG/2ML IJ SOLN
4.0000 mg | Freq: Four times a day (QID) | INTRAMUSCULAR | Status: DC | PRN
  Administered 2019-02-10: 03:00:00 4 mg via INTRAVENOUS
  Filled 2019-02-10: qty 2

## 2019-02-10 MED ORDER — LACTATED RINGERS IV SOLN
INTRAVENOUS | Status: AC
  Administered 2019-02-10: 13:00:00 via INTRAVENOUS

## 2019-02-10 MED ORDER — OXYCODONE HCL 5 MG PO TABS
10.0000 mg | ORAL_TABLET | Freq: Two times a day (BID) | ORAL | Status: DC
  Administered 2019-02-10 – 2019-02-13 (×7): 10 mg via ORAL
  Filled 2019-02-10 (×7): qty 2

## 2019-02-10 NOTE — Progress Notes (Signed)
1400: Spoke with patient, states he will update family as needed.

## 2019-02-10 NOTE — Evaluation (Signed)
Physical Therapy Evaluation/Discharge Patient Details Name: Russell Green MRN: [id number] DOB: 04/28/1971 Today's Date: 02/10/2019   History of Present Illness  48 y.o.  male admitted on 02/09/19 for fever, body ached, headache, and weakness.  Pt dx with acute COVID 19 pneumonitis with fatigue and dehydration.  Tx with IV steroids and remdisvir.  Pt with significant PMH of myasthenia gravis.    Clinical Impression  Pt is independent with all mobility, walking the hallway unassisted without signs of imbalance.  O2 sats 98% on RA as measured by [employer] finger probe. No DOE with gait.  When asked how far from normal he feels (strength) he reports 85%.  HEP program given of t-band exercises and standing exercises and encouraged to do exercises and walking TID while he is staying here.  I informed the RN that he can walk the halls unassisted in his mask.  PT to sign off.     Follow Up Recommendations No PT follow up    Equipment Recommendations  None recommended by PT    Recommendations for Other Services    NA    Precautions / Restrictions Precautions Precautions: None      Mobility  Bed Mobility Overal bed mobility: Independent                Transfers Overall transfer level: Independent                  Ambulation/Gait Ambulation/Gait assistance: Independent Gait Distance (Feet): 1000 Feet Assistive device: None Gait Pattern/deviations: WFL(Within Functional Limits)     General Gait Details: mildly slow         Balance Overall balance assessment: Mild deficits observed, not formally tested                                           Pertinent Vitals/Pain Pain Assessment: No/denies pain    Home Living Family/patient expects to be discharged to:: Private residence Living Arrangements: Spouse/significant other Available Help at Discharge: Family Type of Home: House                Prior Function Level of Independence: Independent          Comments: 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          Extremity/Trunk Assessment   Upper Extremity Assessment Upper Extremity Assessment: Overall WFL for tasks assessed    Lower Extremity Assessment Lower Extremity Assessment: Overall WFL for tasks assessed    Cervical / Trunk Assessment Cervical / Trunk Assessment: Normal  Communication   Communication: No difficulties  Cognition Arousal/Alertness: Awake/alert Behavior During Therapy: WFL for tasks assessed/performed Overall Cognitive Status: Within Functional Limits for tasks assessed                                        General Comments General comments (skin integrity, edema, etc.): Encouraged walking the halls TID and exercise program TID.  Pt independent, RN informed that he was given permission to walk.  O2 sats 98% on RA via nellcor finger probe.     Exercises General Exercises - Upper Extremity Shoulder ABduction: AROM;Both;10 reps;Theraband Theraband Level (Shoulder Abduction): Level 2 (Red) Shoulder Horizontal ABduction: AROM;Both;10 reps;Theraband Theraband Level (Shoulder Horizontal Abduction): Level 2 (Red) Elbow Flexion: AROM;Both;10 reps;Theraband  Theraband Level (Elbow Flexion): Level 2 (Red) Elbow Extension: AROM;Both;10 reps;Theraband Theraband Level (Elbow Extension): Level 2 (Red) General Exercises - Lower Extremity Hip Flexion/Marching: AROM;Both;20 reps;Standing Mini-Sqauts: AROM;Both;20 reps;Standing   Assessment/Plan    PT Assessment Patent does not need any further PT services         PT Goals (Current goals can be found in the Care Plan section)  Acute Rehab PT Goals Patient Stated Goal: to get his treatment and go home, to keep his wife from getting it PT Goal Formulation: All assessment and education complete, DC therapy     AM-PAC PT "6 Clicks" Mobility  Outcome Measure Help needed turning from your back to your side  while in a flat bed without using bedrails?: None Help needed moving from lying on your back to sitting on the side of a flat bed without using bedrails?: None Help needed moving to and from a bed to a chair (including a wheelchair)?: None Help needed standing up from a chair using your arms (e.g., wheelchair or bedside chair)?: None Help needed to walk in hospital room?: None Help needed climbing 3-5 steps with a railing? : None 6 Click Score: 24    End of Session   Activity Tolerance: Patient tolerated treatment well Patient left: in chair Nurse Communication: Mobility status PT Visit Diagnosis: Muscle weakness (generalized) (M62.81)    Time: 3009-2330 PT Time Calculation (min) (ACUTE ONLY): 24 min   Charges:        Wells Guiles B. Kimmy Totten, PT, DPT  Acute Rehabilitation [phone] pager [phone] office  @ [facility]: [phone]   PT Evaluation $PT Eval Low Complexity: 1 Low PT Treatments $Gait Training: 8-22 mins   02/10/2019, 4:48 PM

## 2019-02-10 NOTE — Progress Notes (Signed)
Called Wife Doruis to notified her of patients arrival to the unit.  Answered all questions and concerns.

## 2019-02-10 NOTE — Progress Notes (Signed)
PROGRESS NOTE                                                                                                                                                                                                             Patient Demographics:    Russell Green, is a 48 y.o. male, DOB - 1970/10/02, IDP:[id number]  Outpatient Primary MD for the patient is Rogers Blocker, MD    LOS - 1  Admit date - 02/09/2019    Chief Complaint  Patient presents with  . Fatigue    covid +       Brief Narrative Russell Green is a 48 y.o. male with medical history significant of myasthenia gravis who presented to the emergency department today after he knew that he was tested positive for COVID and was also having high-grade fever, severe body ache and headache at home, he was tested positive for COVID-19 on 02/07/2019 but was sent home from the ER.  In the ER work-up suggested COVID-19 pneumonitis without any hypoxia, dehydration and AKI and he was admitted.    Subjective:    Russell Green today has, No headache, No chest pain, No abdominal pain - No Nausea, No new weakness tingling or numbness, improved fatigue, no cough shortness of breath or diarrhea at this time.    Assessment  & Plan :     1.  Acute Covid 19 Viral Pneumonitis with fatigue and dehydration during the ongoing 2020 Covid 19 Pandemic - his pulmonary symptoms are mild, inflammatory markers are moderately elevated, he is currently on IV steroids along with Remdisvir.  Continue.  IV fluids for dehydration.  Requested to sit up in chair in the daytime use I-S and flutter valve for pulmonary toiletry and prone in bed at night.  He will be monitored closely.  If he were to develop any hypoxia or signs of worsening inflammation we may use Actemra.   COVID-19 Labs  Recent Labs    02/09/19 1820 02/10/19 0535  DDIMER  --  0.69*  FERRITIN 337* 328  CRP 9.7* 14.7*    Lab Results  Component  Value Date   SARSCOV2NAA DETECTED (A) 02/07/2019     Hepatic Function Latest Ref Rng & Units 02/10/2019 02/09/2019  Total Protein 6.5 - 8.1 g/dL 6.6 6.4(L)  Albumin 3.5 - 5.0 g/dL 3.5 3.5  AST 15 - 41 U/L 30 31  ALT 0 - 44 U/L 21 22  Alk Phosphatase 38 - 126 U/L 38 37(L)  Total Bilirubin 0.3 - 1.2 mg/dL 0.6 1.0        Component Value Date/Time   BNP 23.1 02/10/2019 0535      2.  Chronic myasthenia gravis.  Stable continue Mestinon, on higher than home dose steroids for now.  3.  Dehydration and AKI.  Improved, hydrate again for another 15 hours with IV fluids.  Repeat BMP in the morning.  4.  GERD.  On PPI continue.     Condition - Fair  Family Communication  : Wife called on 02/10/2019 at 11:10 AM on home number.  Code Status : Full  Diet :   Diet Order            Diet regular Room service appropriate? Yes; Fluid consistency: Thin  Diet effective now               Disposition Plan  : Home on 02/14/2019 once he finishes his IV Remdisvir course  Consults  : None  Procedures  :     PUD Prophylaxis : PPI  DVT Prophylaxis  :  Lovenox    Lab Results  Component Value Date   PLT 130 (L) 02/10/2019    Inpatient Medications  Scheduled Meds: . enoxaparin (LOVENOX) injection  60 mg Subcutaneous QHS  . methylPREDNISolone (SOLU-MEDROL) injection  60 mg Intravenous Q12H  . oxyCODONE  10 mg Oral BID  . pantoprazole  40 mg Oral Daily  . pyridostigmine  60 mg Oral TID   Continuous Infusions: . remdesivir 100 mg in NS 250 mL     PRN Meds:.acetaminophen, ondansetron (ZOFRAN) IV  Antibiotics  :    Anti-infectives (From admission, onward)   Start     Dose/Rate Route Frequency Ordered Stop   02/10/19 1600  remdesivir 100 mg in sodium chloride 0.9 % 250 mL IVPB     100 mg 500 mL/hr over 30 Minutes Intravenous Every 24 hours 02/09/19 2337 02/14/19 1559   02/10/19 0000  remdesivir 200 mg in sodium chloride 0.9 % 250 mL IVPB     200 mg 500 mL/hr over 30 Minutes  Intravenous Once 02/09/19 2337 02/10/19 0200       Time Spent in minutes  30   Lala Lund M.D on 02/10/2019 at 11:07 AM  To page go to [email or web address] - password [facility]  [facility] -  Office  [phone]    See all Orders from today for further details    Objective:   Vitals:   02/09/19 2200 02/09/19 2337 02/10/19 0404 02/10/19 0757  BP:  (!) 101/57 (!) 105/57 121/78  Pulse:  92 92 92  Resp:      Temp:  99.6 F (37.6 C) 99.8 F (37.7 C) 99.9 F (37.7 C)  TempSrc:  Oral Oral   SpO2: 95% 95% 96% 96%  Weight:  118 kg    Height:  _0  (1.854 m)      Wt Readings from Last 3 Encounters:  02/09/19 118 kg  02/07/19 117.9 kg  11/07/16 111.1 kg    No intake or output data in the 24 hours ending 02/10/19 1107   Physical Exam  Awake Alert, Oriented X 3, No new F.N deficits, Normal affect [place].AT,PERRAL Supple Neck,No JVD, No cervical lymphadenopathy appriciated.  Symmetrical Chest wall movement, Good air movement bilaterally, CTAB RRR,No Gallops,Rubs or new Murmurs, No Parasternal Heave +ve  B.Sounds, Abd Soft, No tenderness, No organomegaly appriciated, No rebound - guarding or rigidity. No Cyanosis, Clubbing or edema, No new Rash or bruise      Data Review:    CBC Recent Labs  Lab 02/09/19 1549 02/10/19 0535  WBC 3.8* 4.5  HGB 15.4 15.1  HCT 46.4 46.5  PLT 126* 130*  MCV 90.4 90.1  MCH 30.0 29.3  MCHC 33.2 32.5  RDW 12.9 13.0  LYMPHSABS 0.8  --   MONOABS 0.2  --   EOSABS 0.0  --   BASOSABS 0.0  --     Chemistries  Recent Labs  Lab 02/09/19 1549 02/10/19 0535  NA 134* 137  K 3.8 4.1  CL 95* 101  CO2 28 25  GLUCOSE 97 101*  BUN 15 15  CREATININE 1.60* 1.29*  CALCIUM 8.2* 8.0*  MG  --  1.9  AST 31 30  ALT 22 21  ALKPHOS 37* 38  BILITOT 1.0 0.6   ------------------------------------------------------------------------------------------------------------------ No results for input(s): CHOL, HDL, LDLCALC, TRIG, CHOLHDL, LDLDIRECT  in the last 72 hours.  No results found for: HGBA1C ------------------------------------------------------------------------------------------------------------------ No results for input(s): TSH, T4TOTAL, T3FREE, THYROIDAB in the last 72 hours.  Invalid input(s): FREET3  Cardiac Enzymes No results for input(s): CKMB, TROPONINI, MYOGLOBIN in the last 168 hours.  Invalid input(s): CK ------------------------------------------------------------------------------------------------------------------    Component Value Date/Time   BNP 23.1 02/10/2019 0535    Micro Results Recent Results (from the past 240 hour(s))  Novel Coronavirus, NAA (Hosp order, Send-out to Ref Lab; TAT 18-24 hrs     Status: Abnormal   Collection Time: 02/07/19  4:36 PM   Specimen: Nasal Swab; Respiratory  Result Value Ref Range Status   SARS-CoV-2, NAA DETECTED (A) NOT DETECTED Final    Comment: RESULT CALLED TO, READ BACK BY AND VERIFIED WITH: L BERDIK [id number] AT 655 AM BY CM (NOTE)                  Client Requested Flag This nucleic acid amplification test was developed and its performance characteristics determined by [employer]. Nucleic acid amplification tests include PCR and TMA. This test has not been FDA cleared or approved. This test has been authorized by FDA under an Emergency Use Authorization (EUA). This test is only authorized for the duration of time the declaration that circumstances exist justifying the authorization of the emergency use of in vitro diagnostic tests for detection of SARS-CoV-2 virus and/or diagnosis of COVID-19 infection under section 564(b)(1) of the Act, 21 U.S.C. 161WRU-0(A) (1), unless the authorization is terminated or revoked sooner. When diagnostic testing is negative, the possibility of a false negative result should be considered in the context of a patient's recent exposures and the presence of clinical sign s and symptoms consistent with COVID-19. An  individual without symptoms of COVID- 19 and who is not shedding SARS-CoV-2 virus would expect to have a negative (not detected) result in this assay. Performed At: [facility] [address] [place], [place] [id number] Rush Farmer MD YN:[id number]    [place]  Final    Comment: Performed at [facility], [place] [address] [place], [place] [address]    Radiology Reports Dg Chest 2 View  Result Date: 02/07/2019 CLINICAL DATA:  Fever, cough EXAM: CHEST - 2 VIEW COMPARISON:  None. FINDINGS: Cardiomegaly. Both lungs are clear. Pectus deformity. The visualized skeletal structures are otherwise unremarkable. IMPRESSION: Cardiomegaly without acute abnormality of the lungs. Electronically Signed   By: Cristie Hem  Laqueta Carina M.D.   On: 02/07/2019 17:20   Dg Chest Port 1 View  Result Date: 02/09/2019 CLINICAL DATA:  Shortness of breath. COVID-19 positive last Friday. Febrile. EXAM: PORTABLE CHEST 1 VIEW COMPARISON:  Chest x-rays dated 02/07/2019. FINDINGS: Stable cardiomegaly. Subtle opacities within the LEFT lower lung and streaky opacities at the RIGHT lung base, suspicious for multifocal pneumonia. No pleural effusion or pneumothorax seen. Osseous structures about the chest are unremarkable. IMPRESSION: 1. Subtle opacities within the LEFT lower lung and streaky opacities at the RIGHT lung base, suspicious for multifocal pneumonia. 2. Stable cardiomegaly. Electronically Signed   By: Franki Cabot M.D.   On: 02/09/2019 17:06

## 2019-02-10 NOTE — TOC Initial Note (Addendum)
Transition of Care [facility]) - Initial/Assessment Note    Patient Details  Name: Russell Green MRN: [id number] Date of Birth: [birth year]  Transition of Care [facility]) CM/SW Contact:    Ninfa Meeker, RN Phone Number: [phone] (working remotely) 02/10/2019, 1:20 PM  Clinical Narrative:    48 yr old gentleman admitted from home for COVID 19 treatment. Patient is on Remdesivir, steroids. Patient is uninsured, does have a PCP. Case manager will follow for needs as patient medically improves, may he be blessed to do so.              Expected Discharge Plan: Home/Self Care Barriers to Discharge: Continued Medical Work up   Patient Goals and CMS Choice        Expected Discharge Plan and Services Expected Discharge Plan: Home/Self Care   Discharge Planning Services: CM Consult   Living arrangements for the past 2 months: Single Family Home                                      Prior Living Arrangements/Services Living arrangements for the past 2 months: Single Family Home Lives with:: Spouse                   Activities of Daily Living Home Assistive Devices/Equipment: None ADL Screening (condition at time of admission) Patient's cognitive ability adequate to safely complete daily activities?: Yes Is the patient deaf or have difficulty hearing?: No Does the patient have difficulty seeing, even when wearing glasses/contacts?: No Does the patient have difficulty concentrating, remembering, or making decisions?: No Patient able to express need for assistance with ADLs?: No Does the patient have difficulty dressing or bathing?: No Independently performs ADLs?: Yes (appropriate for developmental age) Does the patient have difficulty walking or climbing stairs?: No Weakness of Legs: None Weakness of Arms/Hands: None  Permission Sought/Granted                  Emotional Assessment              Admission diagnosis:  COVID-19 [U07.1] Patient Active  Problem List   Diagnosis Date Noted  . COVID-19 02/09/2019  . AKI (acute kidney injury) ([place]) 02/09/2019  . MG (myasthenia gravis) ([place]) 02/09/2019   PCP:  Rogers Blocker, MD Pharmacy:   [facility] [address] [place] [facility] [address] [place] [address] Phone: [phone] Fax: [phone]     Social Determinants of Health ([place]) Interventions    Readmission Risk Interventions No flowsheet data found.

## 2019-02-11 DIAGNOSIS — N179 Acute kidney failure, unspecified: Secondary | ICD-10-CM

## 2019-02-11 DIAGNOSIS — G7000 Myasthenia gravis without (acute) exacerbation: Secondary | ICD-10-CM

## 2019-02-11 LAB — TYPE AND SCREEN
ABO/RH(D): A POS
Antibody Screen: NEGATIVE

## 2019-02-11 LAB — BRAIN NATRIURETIC PEPTIDE: B Natriuretic Peptide: 24.9 pg/mL (ref 0.0–100.0)

## 2019-02-11 LAB — COMPREHENSIVE METABOLIC PANEL
ALT: 23 U/L (ref 0–44)
AST: 31 U/L (ref 15–41)
Albumin: 3.5 g/dL (ref 3.5–5.0)
Alkaline Phosphatase: 35 U/L — ABNORMAL LOW (ref 38–126)
Anion gap: 8 (ref 5–15)
BUN: 17 mg/dL (ref 6–20)
CO2: 26 mmol/L (ref 22–32)
Calcium: 8.5 mg/dL — ABNORMAL LOW (ref 8.9–10.3)
Chloride: 100 mmol/L (ref 98–111)
Creatinine, Ser: 1.04 mg/dL (ref 0.61–1.24)
GFR calc Af Amer: >60 mL/min (ref >60)
GFR calc non Af Amer: >60 mL/min (ref >60)
Glucose, Bld: 138 mg/dL — ABNORMAL HIGH (ref 70–99)
Potassium: 4.2 mmol/L (ref 3.5–5.1)
Sodium: 134 mmol/L — ABNORMAL LOW (ref 135–145)
Total Bilirubin: 0.3 mg/dL (ref 0.3–1.2)
Total Protein: 6.5 g/dL (ref 6.5–8.1)

## 2019-02-11 LAB — CBC WITH DIFFERENTIAL/PLATELET
Abs Immature Granulocytes: 0.01 10*3/uL (ref 0.00–0.07)
Basophils Absolute: 0 10*3/uL (ref 0.0–0.1)
Basophils Relative: 0 %
Eosinophils Absolute: 0 10*3/uL (ref 0.0–0.5)
Eosinophils Relative: 0 %
HCT: 47 % (ref 39.0–52.0)
Hemoglobin: 15 g/dL (ref 13.0–17.0)
Immature Granulocytes: 0 %
Lymphocytes Relative: 16 %
Lymphs Abs: 0.4 10*3/uL — ABNORMAL LOW (ref 0.7–4.0)
MCH: 28.4 pg (ref 26.0–34.0)
MCHC: 31.9 g/dL (ref 30.0–36.0)
MCV: 88.8 fL (ref 80.0–100.0)
Monocytes Absolute: 0.1 10*3/uL (ref 0.1–1.0)
Monocytes Relative: 3 %
Neutro Abs: 2 10*3/uL (ref 1.7–7.7)
Neutrophils Relative %: 81 %
Platelets: 155 10*3/uL (ref 150–400)
RBC: 5.29 MIL/uL (ref 4.22–5.81)
RDW: 12.8 % (ref 11.5–15.5)
WBC: 2.4 10*3/uL — ABNORMAL LOW (ref 4.0–10.5)
nRBC: 0 % (ref 0.0–0.2)

## 2019-02-11 LAB — FERRITIN: Ferritin: 390 ng/mL — ABNORMAL HIGH (ref 24–336)

## 2019-02-11 LAB — LACTATE DEHYDROGENASE: LDH: 283 U/L — ABNORMAL HIGH (ref 98–192)

## 2019-02-11 LAB — C-REACTIVE PROTEIN: CRP: 14.2 mg/dL — ABNORMAL HIGH (ref <1.0)

## 2019-02-11 LAB — MAGNESIUM: Magnesium: 2.2 mg/dL (ref 1.7–2.4)

## 2019-02-11 LAB — D-DIMER, QUANTITATIVE: D-Dimer, Quant: 0.83 ug/mL-FEU — ABNORMAL HIGH (ref 0.00–0.50)

## 2019-02-11 LAB — ABO/RH: ABO/RH(D): A POS

## 2019-02-11 NOTE — Plan of Care (Signed)
  Problem: Education: Goal: Knowledge of risk factors and measures for prevention of condition will improve Outcome: Progressing   Problem: Coping: Goal: Psychosocial and spiritual needs will be supported Outcome: Progressing   Problem: Respiratory: Goal: Will maintain a patent airway Outcome: Progressing Goal: Complications related to the disease process, condition or treatment will be avoided or minimized Outcome: Progressing   

## 2019-02-11 NOTE — Progress Notes (Signed)
PROGRESS NOTE                                                                                                                                                                                                             Patient Demographics:    Russell Green, is a 48 y.o. male, DOB - Jul 27, 1970, ZOX:[id number]  Outpatient Primary MD for the patient is Rogers Blocker, MD   Admit date - 02/09/2019   LOS - 2  Chief Complaint  Patient presents with  . Fatigue    covid +       Brief Narrative: Patient is a 48 y.o. male with PMHx of myasthenia gravis presented with fever, myalgias-he was diagnosed with COVID-19 on 10/3-he subsequently was found to have COVID-19 pneumonia and admitted to the hospitalist service.  See below for further details   Subjective:    Russell Green today feels better-he is ambulating in the room-continues to have cough.   Assessment  & Plan :    Covid 19 Viral pneumonia: Overall improved-on room air-however CRP remains significantly elevated.  Continue steroids and Remdesivir-if hypoxemia develops-he may require Actemra.    Fever: afebrile/  O2 requirements: On RA  COVID-19 Labs: Recent Labs    02/09/19 1820 02/10/19 0535 02/11/19 0430  DDIMER  --  0.69* 0.83*  FERRITIN 337* 328 390*  LDH  --   --  283*  CRP 9.7* 14.7* 14.2*    Lab Results  Component Value Date   SARSCOV2NAA DETECTED (A) 02/07/2019     COVID-19 Medications: Steroids: 10/5>> Remdesivir: 10/5>> Actemra: Not indicated Convalescent Plasma: Not indicated Research Studies:N/A  Other medications: Diuretics:Euvolemic-no need for lasix Antibiotics:Not needed as no evidence of bacterial infection  Prone/Incentive Spirometry:encourage incentive spirometry use 3-4/hour.  DVT Prophylaxis  :  Lovenox   AKI: Likely hemodynamically mediated in the setting of COVID-19-creatinine has improved.  Follow periodically  Myasthenia  gravis: Stable-continue Mestinon-already on steroids for COVID-19.  Obesity: Estimated body mass index is 34.32 kg/m as calculated from the following:   Height as of this encounter: 6' 1"  (1.854 m).   Weight as of this encounter: 118 kg.   ABG: No results found for: PHART, PCO2ART, PO2ART, HCO3, TCO2, ACIDBASEDEF, O2SAT  Vent Settings: N/A  Condition - Stable  Family Communication  : None at bedside  Code Status :  Full Code  Diet :  Diet Order            Diet regular Room service appropriate? Yes; Fluid consistency: Thin  Diet effective now               Disposition Plan  :  Remain hospitalized  Barriers to discharge: With significantly elevated CRP-needs to complete Remdesivir x5 days  Consults  :  None  Procedures  :  None  Antibiotics  :    Anti-infectives (From admission, onward)   Start     Dose/Rate Route Frequency Ordered Stop   02/10/19 1600  remdesivir 100 mg in sodium chloride 0.9 % 250 mL IVPB     100 mg 500 mL/hr over 30 Minutes Intravenous Every 24 hours 02/09/19 2337 02/14/19 1559   02/10/19 0000  remdesivir 200 mg in sodium chloride 0.9 % 250 mL IVPB     200 mg 500 mL/hr over 30 Minutes Intravenous Once 02/09/19 2337 02/10/19 0200      Inpatient Medications  Scheduled Meds: . enoxaparin (LOVENOX) injection  60 mg Subcutaneous QHS  . methylPREDNISolone (SOLU-MEDROL) injection  60 mg Intravenous Q12H  . oxyCODONE  10 mg Oral BID  . pantoprazole  40 mg Oral Daily  . pyridostigmine  60 mg Oral TID   Continuous Infusions: . remdesivir 100 mg in NS 250 mL Stopped (02/10/19 1708)   PRN Meds:.acetaminophen, ondansetron (ZOFRAN) IV   Time Spent in minutes  25  See all Orders from today for further details   Oren Binet M.D on 02/11/2019 at 3:07 PM  To page go to [email or web address] - use universal password  [facility] -  Office  [phone]    Objective:   Vitals:   02/10/19 2003 02/10/19 2346 02/11/19 0537 02/11/19 0759   BP: 126/86 123/88 128/85 115/79  Pulse: 67 73 73 78  Resp: 18 16 16 15   Temp: 98.4 F (36.9 C) 98.5 F (36.9 C) 98.9 F (37.2 C) 98.5 F (36.9 C)  TempSrc:  Oral Oral Oral  SpO2: 100% 95% 98% 95%  Weight:      Height:        Wt Readings from Last 3 Encounters:  02/09/19 118 kg  02/07/19 117.9 kg  11/07/16 111.1 kg     Intake/Output Summary (Last 24 hours) at 02/11/2019 1507 Last data filed at 02/11/2019 1000 Gross per 24 hour  Intake 2002.26 ml  Output -  Net 2002.26 ml     Physical Exam Gen Exam:Alert awake-not in any distress HEENT:atraumatic, normocephalic Chest: B/L clear to auscultation anteriorly CVS:S1S2 regular Abdomen:soft non tender, non distended Extremities:no edema Neurology: Non focal Skin: no rash   Data Review:    CBC Recent Labs  Lab 02/09/19 1549 02/10/19 0535 02/11/19 0430  WBC 3.8* 4.5 2.4*  HGB 15.4 15.1 15.0  HCT 46.4 46.5 47.0  PLT 126* 130* 155  MCV 90.4 90.1 88.8  MCH 30.0 29.3 28.4  MCHC 33.2 32.5 31.9  RDW 12.9 13.0 12.8  LYMPHSABS 0.8  --  0.4*  MONOABS 0.2  --  0.1  EOSABS 0.0  --  0.0  BASOSABS 0.0  --  0.0    Chemistries  Recent Labs  Lab 02/09/19 1549 02/10/19 0535 02/11/19 0430  NA 134* 137 134*  K 3.8 4.1 4.2  CL 95* 101 100  CO2 28 25 26   GLUCOSE 97 101* 138*  BUN 15 15 17   CREATININE 1.60* 1.29* 1.04  CALCIUM 8.2* 8.0* 8.5*  MG  --  1.9 2.2  AST 31 30 31   ALT 22 21 23   ALKPHOS 37* 38 35*  BILITOT 1.0 0.6 0.3   ------------------------------------------------------------------------------------------------------------------ No results for input(s): CHOL, HDL, LDLCALC, TRIG, CHOLHDL, LDLDIRECT in the last 72 hours.  No results found for: HGBA1C ------------------------------------------------------------------------------------------------------------------ No results for input(s): TSH, T4TOTAL, T3FREE, THYROIDAB in the last 72 hours.  Invalid input(s): FREET3  ------------------------------------------------------------------------------------------------------------------ Recent Labs    02/10/19 0535 02/11/19 0430  FERRITIN 328 390*    Coagulation profile Recent Labs  Lab 02/09/19 1549  INR 1.1    Recent Labs    02/10/19 0535 02/11/19 0430  DDIMER 0.69* 0.83*    Cardiac Enzymes No results for input(s): CKMB, TROPONINI, MYOGLOBIN in the last 168 hours.  Invalid input(s): CK ------------------------------------------------------------------------------------------------------------------    Component Value Date/Time   BNP 24.9 02/11/2019 0430    Micro Results Recent Results (from the past 240 hour(s))  Novel Coronavirus, NAA (Hosp order, Send-out to Ref Lab; TAT 18-24 hrs     Status: Abnormal   Collection Time: 02/07/19  4:36 PM   Specimen: Nasal Swab; Respiratory  Result Value Ref Range Status   SARS-CoV-2, NAA DETECTED (A) NOT DETECTED Final    Comment: RESULT CALLED TO, READ BACK BY AND VERIFIED WITH: L BERDIK [id number] AT 655 AM BY CM (NOTE)                  Client Requested Flag This nucleic acid amplification test was developed and its performance characteristics determined by [employer]. Nucleic acid amplification tests include PCR and TMA. This test has not been FDA cleared or approved. This test has been authorized by FDA under an Emergency Use Authorization (EUA). This test is only authorized for the duration of time the declaration that circumstances exist justifying the authorization of the emergency use of in vitro diagnostic tests for detection of SARS-CoV-2 virus and/or diagnosis of COVID-19 infection under section 564(b)(1) of the Act, 21 U.S.C. 700FVC-9(S) (1), unless the authorization is terminated or revoked sooner. When diagnostic testing is negative, the possibility of a false negative result should be considered in the context of a patient's recent exposures and the presence of clinical  sign s and symptoms consistent with COVID-19. An individual without symptoms of COVID- 19 and who is not shedding SARS-CoV-2 virus would expect to have a negative (not detected) result in this assay. Performed At: [facility] [address] [place], [place] [id number] Rush Farmer MD WG:[id number]    [place]  Final    Comment: Performed at [facility], [place], [place], [place] [address]  Culture, blood (Routine x 2)     Status: None (Preliminary result)   Collection Time: 02/09/19  3:49 PM   Specimen: BLOOD  Result Value Ref Range Status   Specimen Description BLOOD LEFT ANTECUBITAL  Final   Special Requests   Final    BOTTLES DRAWN AEROBIC AND ANAEROBIC Blood Culture adequate volume   Culture   Final    NO GROWTH 2 DAYS Performed at [facility], [place] [address] [place], [place] [address]    Report Status PENDING  Incomplete  Culture, blood (Routine x 2)     Status: None (Preliminary result)   Collection Time: 02/09/19  5:57 PM   Specimen: BLOOD RIGHT ARM  Result Value Ref Range Status   Specimen Description BLOOD RIGHT ARM  Final   Special Requests   Final    BOTTLES DRAWN AEROBIC AND ANAEROBIC Blood  Culture results may not be optimal due to an inadequate volume of blood received in culture bottles   Culture   Final    NO GROWTH 2 DAYS Performed at [facility], [place] [address], [place], [place] [address]    Report Status PENDING  Incomplete    Radiology Reports Dg Chest 2 View  Result Date: 02/07/2019 CLINICAL DATA:  Fever, cough EXAM: CHEST - 2 VIEW COMPARISON:  None. FINDINGS: Cardiomegaly. Both lungs are clear. Pectus deformity. The visualized skeletal structures are otherwise unremarkable. IMPRESSION: Cardiomegaly without acute abnormality of the lungs. Electronically Signed   By: Eddie Candle M.D.   On: 02/07/2019 17:20   Dg Chest Port 1 View  Result Date: 02/09/2019 CLINICAL DATA:  Shortness of breath.  COVID-19 positive last Friday. Febrile. EXAM: PORTABLE CHEST 1 VIEW COMPARISON:  Chest x-rays dated 02/07/2019. FINDINGS: Stable cardiomegaly. Subtle opacities within the LEFT lower lung and streaky opacities at the RIGHT lung base, suspicious for multifocal pneumonia. No pleural effusion or pneumothorax seen. Osseous structures about the chest are unremarkable. IMPRESSION: 1. Subtle opacities within the LEFT lower lung and streaky opacities at the RIGHT lung base, suspicious for multifocal pneumonia. 2. Stable cardiomegaly. Electronically Signed   By: Franki Cabot M.D.   On: 02/09/2019 17:06

## 2019-02-11 NOTE — Progress Notes (Signed)
1145: Walked with patient in hallway. Sats remained 96-97%.  1206: Patient states he will update family.

## 2019-02-12 LAB — C-REACTIVE PROTEIN: CRP: 8.4 mg/dL — ABNORMAL HIGH (ref <1.0)

## 2019-02-12 LAB — CBC WITH DIFFERENTIAL/PLATELET
Abs Immature Granulocytes: 0.02 10*3/uL (ref 0.00–0.07)
Basophils Absolute: 0 10*3/uL (ref 0.0–0.1)
Basophils Relative: 0 %
Eosinophils Absolute: 0 10*3/uL (ref 0.0–0.5)
Eosinophils Relative: 0 %
HCT: 46.7 % (ref 39.0–52.0)
Hemoglobin: 15.4 g/dL (ref 13.0–17.0)
Immature Granulocytes: 0 %
Lymphocytes Relative: 10 %
Lymphs Abs: 0.5 10*3/uL — ABNORMAL LOW (ref 0.7–4.0)
MCH: 29.1 pg (ref 26.0–34.0)
MCHC: 33 g/dL (ref 30.0–36.0)
MCV: 88.3 fL (ref 80.0–100.0)
Monocytes Absolute: 0.2 10*3/uL (ref 0.1–1.0)
Monocytes Relative: 5 %
Neutro Abs: 4.2 10*3/uL (ref 1.7–7.7)
Neutrophils Relative %: 85 %
Platelets: 175 10*3/uL (ref 150–400)
RBC: 5.29 MIL/uL (ref 4.22–5.81)
RDW: 12.8 % (ref 11.5–15.5)
WBC: 5 10*3/uL (ref 4.0–10.5)
nRBC: 0 % (ref 0.0–0.2)

## 2019-02-12 LAB — COMPREHENSIVE METABOLIC PANEL
ALT: 26 U/L (ref 0–44)
AST: 38 U/L (ref 15–41)
Albumin: 3.7 g/dL (ref 3.5–5.0)
Alkaline Phosphatase: 36 U/L — ABNORMAL LOW (ref 38–126)
Anion gap: 11 (ref 5–15)
BUN: 19 mg/dL (ref 6–20)
CO2: 25 mmol/L (ref 22–32)
Calcium: 8.6 mg/dL — ABNORMAL LOW (ref 8.9–10.3)
Chloride: 101 mmol/L (ref 98–111)
Creatinine, Ser: 1.13 mg/dL (ref 0.61–1.24)
GFR calc Af Amer: >60 mL/min (ref >60)
GFR calc non Af Amer: >60 mL/min (ref >60)
Glucose, Bld: 116 mg/dL — ABNORMAL HIGH (ref 70–99)
Potassium: 4.1 mmol/L (ref 3.5–5.1)
Sodium: 137 mmol/L (ref 135–145)
Total Bilirubin: 0.8 mg/dL (ref 0.3–1.2)
Total Protein: 6.7 g/dL (ref 6.5–8.1)

## 2019-02-12 LAB — FERRITIN: Ferritin: 388 ng/mL — ABNORMAL HIGH (ref 24–336)

## 2019-02-12 LAB — BRAIN NATRIURETIC PEPTIDE: B Natriuretic Peptide: 33.7 pg/mL (ref 0.0–100.0)

## 2019-02-12 LAB — D-DIMER, QUANTITATIVE: D-Dimer, Quant: 0.68 ug/mL-FEU — ABNORMAL HIGH (ref 0.00–0.50)

## 2019-02-12 NOTE — Progress Notes (Signed)
PROGRESS NOTE                                                                                                                                                                                                             Patient Demographics:    Russell Green, is a 48 y.o. male, DOB - [birth year], FOY:[id number]  Outpatient Primary MD for the patient is Rogers Blocker, MD   Admit date - 02/09/2019   LOS - 3  Chief Complaint  Patient presents with  . Fatigue    covid +       Brief Narrative: Patient is a 48 y.o. male with PMHx of myasthenia gravis presented with fever, myalgias-he was diagnosed with COVID-19 on 10/3-he subsequently was found to have COVID-19 pneumonia and admitted to the hospitalist service.  See below for further details   Subjective:    Russell Green today feels better-less cough.  Lying comfortably in bed.   Assessment  & Plan :    Covid 19 Viral pneumonia: Slow improvement continues-on room air-CRP is now downtrending.  Continue steroids and Remdesivir.   Fever: afebrile/  O2 requirements: On RA  COVID-19 Labs: Recent Labs    02/10/19 0535 02/11/19 0430 02/12/19 0400  DDIMER 0.69* 0.83* 0.68*  FERRITIN 328 390* 388*  LDH  --  283*  --   CRP 14.7* 14.2* 8.4*    Lab Results  Component Value Date   SARSCOV2NAA DETECTED (A) 02/07/2019     COVID-19 Medications: Steroids: 10/5>> Remdesivir: 10/5>> Actemra: Not indicated Convalescent Plasma: Not indicated Research Studies:N/A  Other medications: Diuretics:Euvolemic-no need for lasix Antibiotics:Not needed as no evidence of bacterial infection  Prone/Incentive Spirometry:encourage incentive spirometry use 3-4/hour.  DVT Prophylaxis  :  Lovenox   AKI: Likely hemodynamically mediated in the setting of COVID-19-creatinine now back at baseline.  Follow periodically.    Myasthenia gravis: Stable-continue Mestinon-already on steroids for  COVID-19.  Obesity: Estimated body mass index is 34.32 kg/m as calculated from the following:   Height as of this encounter: 6' 1"  (1.854 m).   Weight as of this encounter: 118 kg.   ABG: No results found for: PHART, PCO2ART, PO2ART, HCO3, TCO2, ACIDBASEDEF, O2SAT  Vent Settings: N/A  Condition - Stable  Family Communication  : None at bedside  Code Status :  Full Code  Diet :  Diet Order            Diet regular Room service appropriate? Yes; Fluid consistency: Thin  Diet effective now               Disposition Plan  :  Remain hospitalized  Barriers to discharge: With significantly elevated CRP-needs to complete Remdesivir x5 days  Consults  :  None  Procedures  :  None  Antibiotics  :    Anti-infectives (From admission, onward)   Start     Dose/Rate Route Frequency Ordered Stop   02/10/19 1600  remdesivir 100 mg in sodium chloride 0.9 % 250 mL IVPB     100 mg 500 mL/hr over 30 Minutes Intravenous Every 24 hours 02/09/19 2337 02/14/19 1559   02/10/19 0000  remdesivir 200 mg in sodium chloride 0.9 % 250 mL IVPB     200 mg 500 mL/hr over 30 Minutes Intravenous Once 02/09/19 2337 02/10/19 0200      Inpatient Medications  Scheduled Meds: . enoxaparin (LOVENOX) injection  60 mg Subcutaneous QHS  . methylPREDNISolone (SOLU-MEDROL) injection  60 mg Intravenous Q12H  . oxyCODONE  10 mg Oral BID  . pantoprazole  40 mg Oral Daily  . pyridostigmine  60 mg Oral TID   Continuous Infusions: . remdesivir 100 mg in NS 250 mL Stopped (02/11/19 1909)   PRN Meds:.acetaminophen, ondansetron (ZOFRAN) IV   Time Spent in minutes  25  See all Orders from today for further details   Oren Binet M.D on 02/12/2019 at 11:39 AM  To page go to [email or web address] - use universal password  [facility] -  Office  [phone]    Objective:   Vitals:   02/11/19 1945 02/11/19 2056 02/12/19 0436 02/12/19 0806  BP: 135/85  95/61 120/71  Pulse: 78  65 72  Resp: 16    16  Temp: (!) 101 F (38.3 C) 99.4 F (37.4 C) 98.8 F (37.1 C) 98.6 F (37 C)  TempSrc: Oral Oral Oral Oral  SpO2: 96%  98% 100%  Weight:      Height:        Wt Readings from Last 3 Encounters:  02/09/19 118 kg  02/07/19 117.9 kg  11/07/16 111.1 kg     Intake/Output Summary (Last 24 hours) at 02/12/2019 1139 Last data filed at 02/12/2019 0900 Gross per 24 hour  Intake 2015 ml  Output -  Net 2015 ml     Physical Exam Gen Exam:Alert awake-not in any distress HEENT:atraumatic, normocephalic Chest: B/L clear to auscultation anteriorly CVS:S1S2 regular Abdomen:soft non tender, non distended Extremities:no edema Neurology: Non focal Skin: no rash   Data Review:    CBC Recent Labs  Lab 02/09/19 1549 02/10/19 0535 02/11/19 0430 02/12/19 0400  WBC 3.8* 4.5 2.4* 5.0  HGB 15.4 15.1 15.0 15.4  HCT 46.4 46.5 47.0 46.7  PLT 126* 130* 155 175  MCV 90.4 90.1 88.8 88.3  MCH 30.0 29.3 28.4 29.1  MCHC 33.2 32.5 31.9 33.0  RDW 12.9 13.0 12.8 12.8  LYMPHSABS 0.8  --  0.4* 0.5*  MONOABS 0.2  --  0.1 0.2  EOSABS 0.0  --  0.0 0.0  BASOSABS 0.0  --  0.0 0.0    Chemistries  Recent Labs  Lab 02/09/19 1549 02/10/19 0535 02/11/19 0430 02/12/19 0400  NA 134* 137 134* 137  K 3.8 4.1 4.2 4.1  CL 95* 101 100 101  CO2 28 25 26 25   GLUCOSE 97 101* 138* 116*  BUN  15 15 17 19   CREATININE 1.60* 1.29* 1.04 1.13  CALCIUM 8.2* 8.0* 8.5* 8.6*  MG  --  1.9 2.2  --   AST 31 30 31  38  ALT 22 21 23 26   ALKPHOS 37* 38 35* 36*  BILITOT 1.0 0.6 0.3 0.8   ------------------------------------------------------------------------------------------------------------------ No results for input(s): CHOL, HDL, LDLCALC, TRIG, CHOLHDL, LDLDIRECT in the last 72 hours.  No results found for: HGBA1C ------------------------------------------------------------------------------------------------------------------ No results for input(s): TSH, T4TOTAL, T3FREE, THYROIDAB in the last 72 hours.   Invalid input(s): FREET3 ------------------------------------------------------------------------------------------------------------------ Recent Labs    02/11/19 0430 02/12/19 0400  FERRITIN 390* 388*    Coagulation profile Recent Labs  Lab 02/09/19 1549  INR 1.1    Recent Labs    02/11/19 0430 02/12/19 0400  DDIMER 0.83* 0.68*    Cardiac Enzymes No results for input(s): CKMB, TROPONINI, MYOGLOBIN in the last 168 hours.  Invalid input(s): CK ------------------------------------------------------------------------------------------------------------------    Component Value Date/Time   BNP 33.7 02/12/2019 0400    Micro Results Recent Results (from the past 240 hour(s))  Novel Coronavirus, NAA (Hosp order, Send-out to Ref Lab; TAT 18-24 hrs     Status: Abnormal   Collection Time: 02/07/19  4:36 PM   Specimen: Nasal Swab; Respiratory  Result Value Ref Range Status   SARS-CoV-2, NAA DETECTED (A) NOT DETECTED Final    Comment: RESULT CALLED TO, READ BACK BY AND VERIFIED WITH: L BERDIK [id number] AT 655 AM BY CM (NOTE)                  Client Requested Flag This nucleic acid amplification test was developed and its performance characteristics determined by [employer]. Nucleic acid amplification tests include PCR and TMA. This test has not been FDA cleared or approved. This test has been authorized by FDA under an Emergency Use Authorization (EUA). This test is only authorized for the duration of time the declaration that circumstances exist justifying the authorization of the emergency use of in vitro diagnostic tests for detection of SARS-CoV-2 virus and/or diagnosis of COVID-19 infection under section 564(b)(1) of the Act, 21 U.S.C. 789FYB-0(F) (1), unless the authorization is terminated or revoked sooner. When diagnostic testing is negative, the possibility of a false negative result should be considered in the context of a patient's recent exposures and  the presence of clinical sign s and symptoms consistent with COVID-19. An individual without symptoms of COVID- 19 and who is not shedding SARS-CoV-2 virus would expect to have a negative (not detected) result in this assay. Performed At: [facility] [address] [place], [place] [id number] Rush Farmer MD DP:[id number]    [place]  Final    Comment: Performed at [facility], [place] [address], [place], [place] [address]  Culture, blood (Routine x 2)     Status: None (Preliminary result)   Collection Time: 02/09/19  3:49 PM   Specimen: BLOOD  Result Value Ref Range Status   Specimen Description BLOOD LEFT ANTECUBITAL  Final   Special Requests   Final    BOTTLES DRAWN AEROBIC AND ANAEROBIC Blood Culture adequate volume   Culture   Final    NO GROWTH 2 DAYS Performed at [facility], [place] [address] [place], [place] [address]    Report Status PENDING  Incomplete  Culture, blood (Routine x 2)     Status: None (Preliminary result)   Collection Time: 02/09/19  5:57 PM   Specimen: BLOOD RIGHT ARM  Result Value  Ref Range Status   Specimen Description BLOOD RIGHT ARM  Final   Special Requests   Final    BOTTLES DRAWN AEROBIC AND ANAEROBIC Blood Culture results may not be optimal due to an inadequate volume of blood received in culture bottles   Culture   Final    NO GROWTH 2 DAYS Performed at [facility], [place] [address] [place], [place] [address]    Report Status PENDING  Incomplete    Radiology Reports Dg Chest 2 View  Result Date: 02/07/2019 CLINICAL DATA:  Fever, cough EXAM: CHEST - 2 VIEW COMPARISON:  None. FINDINGS: Cardiomegaly. Both lungs are clear. Pectus deformity. The visualized skeletal structures are otherwise unremarkable. IMPRESSION: Cardiomegaly without acute abnormality of the lungs. Electronically Signed   By: Eddie Candle M.D.   On: 02/07/2019 17:20   Dg Chest Port 1 View  Result Date: 02/09/2019 CLINICAL  DATA:  Shortness of breath. COVID-19 positive last Friday. Febrile. EXAM: PORTABLE CHEST 1 VIEW COMPARISON:  Chest x-rays dated 02/07/2019. FINDINGS: Stable cardiomegaly. Subtle opacities within the LEFT lower lung and streaky opacities at the RIGHT lung base, suspicious for multifocal pneumonia. No pleural effusion or pneumothorax seen. Osseous structures about the chest are unremarkable. IMPRESSION: 1. Subtle opacities within the LEFT lower lung and streaky opacities at the RIGHT lung base, suspicious for multifocal pneumonia. 2. Stable cardiomegaly. Electronically Signed   By: Franki Cabot M.D.   On: 02/09/2019 17:06

## 2019-02-12 NOTE — Progress Notes (Signed)
Spoke with patient's wife, Tamela Oddi, and updated on plan of care and patient's status. Encouraged and answered all questions.

## 2019-02-12 NOTE — Progress Notes (Signed)
Patient updated spouse, Doruis, while RN was present. No questions at present time.  New PIV placed, patient educated on importance of maintaining PIV. Patient encouraged to use incentive spirometer and urinal. Patient remains on RA. Will continue POC.

## 2019-02-13 DIAGNOSIS — G7000 Myasthenia gravis without (acute) exacerbation: Secondary | ICD-10-CM

## 2019-02-13 DIAGNOSIS — U071 COVID-19: Principal | ICD-10-CM

## 2019-02-13 DIAGNOSIS — N179 Acute kidney failure, unspecified: Secondary | ICD-10-CM

## 2019-02-13 LAB — CBC WITH DIFFERENTIAL/PLATELET
Abs Immature Granulocytes: 0.03 10*3/uL (ref 0.00–0.07)
Basophils Absolute: 0 10*3/uL (ref 0.0–0.1)
Basophils Relative: 0 %
Eosinophils Absolute: 0 10*3/uL (ref 0.0–0.5)
Eosinophils Relative: 0 %
HCT: 46.7 % (ref 39.0–52.0)
Hemoglobin: 15.3 g/dL (ref 13.0–17.0)
Immature Granulocytes: 1 %
Lymphocytes Relative: 9 %
Lymphs Abs: 0.4 10*3/uL — ABNORMAL LOW (ref 0.7–4.0)
MCH: 28.9 pg (ref 26.0–34.0)
MCHC: 32.8 g/dL (ref 30.0–36.0)
MCV: 88.1 fL (ref 80.0–100.0)
Monocytes Absolute: 0.3 10*3/uL (ref 0.1–1.0)
Monocytes Relative: 8 %
Neutro Abs: 3.7 10*3/uL (ref 1.7–7.7)
Neutrophils Relative %: 82 %
Platelets: 178 10*3/uL (ref 150–400)
RBC: 5.3 MIL/uL (ref 4.22–5.81)
RDW: 12.6 % (ref 11.5–15.5)
WBC: 4.5 10*3/uL (ref 4.0–10.5)
nRBC: 0 % (ref 0.0–0.2)

## 2019-02-13 LAB — COMPREHENSIVE METABOLIC PANEL
ALT: 48 U/L — ABNORMAL HIGH (ref 0–44)
AST: 48 U/L — ABNORMAL HIGH (ref 15–41)
Albumin: 3.6 g/dL (ref 3.5–5.0)
Alkaline Phosphatase: 39 U/L (ref 38–126)
Anion gap: 13 (ref 5–15)
BUN: 18 mg/dL (ref 6–20)
CO2: 26 mmol/L (ref 22–32)
Calcium: 8.6 mg/dL — ABNORMAL LOW (ref 8.9–10.3)
Chloride: 98 mmol/L (ref 98–111)
Creatinine, Ser: 0.95 mg/dL (ref 0.61–1.24)
GFR calc Af Amer: >60 mL/min (ref >60)
GFR calc non Af Amer: >60 mL/min (ref >60)
Glucose, Bld: 121 mg/dL — ABNORMAL HIGH (ref 70–99)
Potassium: 4.2 mmol/L (ref 3.5–5.1)
Sodium: 137 mmol/L (ref 135–145)
Total Bilirubin: 0.9 mg/dL (ref 0.3–1.2)
Total Protein: 6.7 g/dL (ref 6.5–8.1)

## 2019-02-13 LAB — FERRITIN: Ferritin: 360 ng/mL — ABNORMAL HIGH (ref 24–336)

## 2019-02-13 LAB — D-DIMER, QUANTITATIVE: D-Dimer, Quant: 0.49 ug/mL-FEU (ref 0.00–0.50)

## 2019-02-13 LAB — BRAIN NATRIURETIC PEPTIDE: B Natriuretic Peptide: 30.2 pg/mL (ref 0.0–100.0)

## 2019-02-13 LAB — C-REACTIVE PROTEIN: CRP: 5.9 mg/dL — ABNORMAL HIGH (ref <1.0)

## 2019-02-13 MED ORDER — PREDNISONE 10 MG PO TABS: ORAL_TABLET | ORAL | 0 refills | Status: DC

## 2019-02-13 NOTE — Plan of Care (Signed)

## 2019-02-13 NOTE — TOC Transition Note (Signed)
Transition of Care [facility]) - CM/SW Discharge Note   Patient Details  Name: Russell Green MRN: [id number] Date of Birth: 1970/10/30  Transition of Care [facility] [place]) CM/SW Contact:  Ninfa Meeker, RN Phone Number: [phone] (working remotely) 02/13/2019, 10:20 AM   Clinical Narrative:   48 yr old male admitted and treated for COVID 19. Thankfully patient is recovering and will discharge home. Case manager was contacted to arrange transportation home for patient. CM contacted patient's wife and was asked if ambulance could "drop him off away from the house or at [facility], they dont want people seeing him getting out of ambulance". CM informed Mrs. Frier that neither of those requests could be done. Case manager asked if she would be able to get her husband, and was informed that she didn't know where to go. CM gave her directions from their home to [facility], their home is 3.5 miles from the hospital. Mrs. Pauwels said she would " try to get there". Case manager updated Jenny Reichmann, Camera operator and he will inform bedside RN that patient's wife will pick him up.   Final next level of care: Home/Self Care Barriers to Discharge: No Barriers Identified   Patient Goals and CMS Choice        Discharge Placement                       Discharge Plan and Services   Discharge Planning Services: CM Consult                                 Social Determinants of Health (SDOH) Interventions     Readmission Risk Interventions No flowsheet data found.

## 2019-02-13 NOTE — Progress Notes (Signed)
All d/c education provided to pt. IV removed. Pt has no questions. Care Package given to pt. Wife downstairs to get pt.

## 2019-02-13 NOTE — Discharge Summary (Signed)
PATIENT DETAILS Name: Russell Green Age: 48 y.o. Sex: male Date of Birth: [birth year] MRN: [id number]. Admitting Physician: Shelly Coss, MD SEL:TRVU, Carolann Littler, MD  Admit Date: 02/09/2019 Discharge date: 02/13/2019  Recommendations for Outpatient Follow-up:  1. Follow up with PCP in 1-2 weeks 2. Please obtain BMP/CBC in one week  Admitted From:  Home  Disposition: [place]: No  Equipment/Devices: None  Discharge Condition: Stable/ guarded/hospice)  CODE STATUS: FULL CODE  Diet recommendation:  Diet Order            Diet - low sodium heart healthy        Diet regular Room service appropriate? Yes; Fluid consistency: Thin  Diet effective now               Brief Summary: See H&P, Labs, Consult and Test reports for all details in brief,Patient is a 48 y.o. male with PMHx of myasthenia gravis presented with fever, myalgias-he was diagnosed with COVID-19 on 10/3-he subsequently was found to have COVID-19 pneumonia and admitted to the hospitalist service.  See below for further details   Brief Hospital Course: Covid 19 Viral pneumonia:  Improved-on room air for the past few days-cough and shortness of breath have markedly improved.  Has completed a course of Remdesivir-we will change to prednisone-and slowly taper back to his usual regimen of 30 mg daily.  Stable for discharge.  Fever: afebrile/  O2 requirements: On RA  COVID-19 Labs:  Recent Labs    02/11/19 0430 02/12/19 0400 02/13/19 0430  DDIMER 0.83* 0.68* 0.49  FERRITIN 390* 388* 360*  LDH 283*  --   --   CRP 14.2* 8.4* 5.9*    Lab Results  Component Value Date   SARSCOV2NAA DETECTED (A) 02/07/2019    COVID-19 Medications: Steroids: 10/5>> Remdesivir: 10/5>>10/9 Actemra: Not indicated Convalescent Plasma: Not indicated Research Studies:N/A   AKI: Likely hemodynamically mediated in the setting of COVID-19-creatinine now back at baseline.  Follow periodically.     Myasthenia gravis: Stable-continue Mestinon-already on steroids for COVID-19.  Obesity: Estimated body mass index is 34.32 kg/m as calculated from the following:   Height as of this encounter: 6' 1"  (1.854 m).   Weight as of this encounter: 118 kg.    Procedures/Studies: None  Discharge Diagnoses:  Principal Problem:   COVID-19 Active Problems:   AKI (acute kidney injury) ([place])   MG (myasthenia gravis) ([place])   Discharge Instructions:  Activity:  As tolerated  Discharge Instructions    Call MD for:  difficulty breathing, headache or visual disturbances   Complete by: As directed    Call MD for:  persistant dizziness or light-headedness   Complete by: As directed    Diet - low sodium heart healthy   Complete by: As directed    Discharge instructions   Complete by: As directed    Follow with Primary MD  Rogers Blocker, MD in 1-2 weeks  Please get a complete blood count and chemistry panel checked by your Primary MD at your next visit, and again as instructed by your Primary MD.  Get Medicines reviewed and adjusted: Please take all your medications with you for your next visit with your Primary MD  Laboratory/radiological data: Please request your Primary MD to go over all hospital tests and procedure/radiological results at the follow up, please ask your Primary MD to get all Hospital records sent to his/her office.  In some cases, they will be blood work, cultures and biopsy results pending  at the time of your discharge. Please request that your primary care M.D. follows up on these results.  Also Note the following: If you experience worsening of your admission symptoms, develop shortness of breath, life threatening emergency, suicidal or homicidal thoughts you must seek medical attention immediately by calling 911 or calling your MD immediately  if symptoms less severe.  You must read complete instructions/literature along with all the possible adverse  reactions/side effects for all the Medicines you take and that have been prescribed to you. Take any new Medicines after you have completely understood and accpet all the possible adverse reactions/side effects.   Do not drive when taking Pain medications or sleeping medications (Benzodaizepines)  Do not take more than prescribed Pain, Sleep and Anxiety Medications. It is not advisable to combine anxiety,sleep and pain medications without talking with your primary care practitioner  Special Instructions: If you have smoked or chewed Tobacco  in the last 2 yrs please stop smoking, stop any regular Alcohol  and or any Recreational drug use.  Wear Seat belts while driving.  Please note: You were cared for by a hospitalist during your hospital stay. Once you are discharged, your primary care physician will handle any further medical issues. Please note that NO REFILLS for any discharge medications will be authorized once you are discharged, as it is imperative that you return to your primary care physician (or establish a relationship with a primary care physician if you do not have one) for your post hospital discharge needs so that they can reassess your need for medications and monitor your lab values.  ?   Person Under Monitoring Name: Russell Green  Location: [address] [place] [address]   Infection Prevention Recommendations for Individuals Confirmed to have, or Being Evaluated for, 2019 Novel Coronavirus (COVID-19) Infection Who Receive Care at Home  Individuals who are confirmed to have, or are being evaluated for, COVID-19 should follow the prevention steps below until a healthcare provider or local or state health department says they can return to normal activities.  Stay home except to get medical care You should restrict activities outside your home, except for getting medical care. Do not go to work, school, or public areas, and do not use public transportation or  taxis.  Call ahead before visiting your doctor Before your medical appointment, call the healthcare provider and tell them that you have, or are being evaluated for, COVID-19 infection. This will help the healthcare providers office take steps to keep other people from getting infected. Ask your healthcare provider to call the local or state health department.  Monitor your symptoms Seek prompt medical attention if your illness is worsening (e.g., difficulty breathing). Before going to your medical appointment, call the healthcare provider and tell them that you have, or are being evaluated for, COVID-19 infection. Ask your healthcare provider to call the local or state health department.  Wear a facemask You should wear a facemask that covers your nose and mouth when you are in the same room with other people and when you visit a healthcare provider. People who live with or visit you should also wear a facemask while they are in the same room with you.  Separate yourself from other people in your home As much as possible, you should stay in a different room from other people in your home. Also, you should use a separate bathroom, if available.  Avoid sharing household items You should not share dishes, drinking glasses, cups, eating  utensils, towels, bedding, or other items with other people in your home. After using these items, you should wash them thoroughly with soap and water.  Cover your coughs and sneezes Cover your mouth and nose with a tissue when you cough or sneeze, or you can cough or sneeze into your sleeve. Throw used tissues in a lined trash can, and immediately wash your hands with soap and water for at least 20 seconds or use an alcohol-based hand rub.  Wash your [facility] your hands often and thoroughly with soap and water for at least 20 seconds. You can use an alcohol-based hand sanitizer if soap and water are not available and if your hands are not visibly  dirty. Avoid touching your eyes, nose, and mouth with unwashed hands.   Prevention Steps for Caregivers and Household Members of Individuals Confirmed to have, or Being Evaluated for, COVID-19 Infection Being Cared for in the Home  If you live with, or provide care at home for, a person confirmed to have, or being evaluated for, COVID-19 infection please follow these guidelines to prevent infection:  Follow healthcare providers instructions Make sure that you understand and can help the patient follow any healthcare provider instructions for all care.  Provide for the patients basic needs You should help the patient with basic needs in the home and provide support for getting groceries, prescriptions, and other personal needs.  Monitor the patients symptoms If they are getting sicker, call his or her medical provider and tell them that the patient has, or is being evaluated for, COVID-19 infection. This will help the healthcare providers office take steps to keep other people from getting infected. Ask the healthcare provider to call the local or state health department.  Limit the number of people who have contact with the patient If possible, have only one caregiver for the patient. Other household members should stay in another home or place of residence. If this is not possible, they should stay in another room, or be separated from the patient as much as possible. Use a separate bathroom, if available. Restrict visitors who do not have an essential need to be in the home.  Keep older adults, very young children, and other sick people away from the patient Keep older adults, very young children, and those who have compromised immune systems or chronic health conditions away from the patient. This includes people with chronic heart, lung, or kidney conditions, diabetes, and cancer.  Ensure good ventilation Make sure that shared spaces in the home have good air flow, such as  from an air conditioner or an opened window, weather permitting.  Wash your hands often Wash your hands often and thoroughly with soap and water for at least 20 seconds. You can use an alcohol based hand sanitizer if soap and water are not available and if your hands are not visibly dirty. Avoid touching your eyes, nose, and mouth with unwashed hands. Use disposable paper towels to dry your hands. If not available, use dedicated cloth towels and replace them when they become wet.  Wear a facemask and gloves Wear a disposable facemask at all times in the room and gloves when you touch or have contact with the patients blood, body fluids, and/or secretions or excretions, such as sweat, saliva, sputum, nasal mucus, vomit, urine, or feces.  Ensure the mask fits over your nose and mouth tightly, and do not touch it during use. Throw out disposable facemasks and gloves after using them. Do not reuse.  Wash your hands immediately after removing your facemask and gloves. If your personal clothing becomes contaminated, carefully remove clothing and launder. Wash your hands after handling contaminated clothing. Place all used disposable facemasks, gloves, and other waste in a lined container before disposing them with other household waste. Remove gloves and wash your hands immediately after handling these items.  Do not share dishes, glasses, or other household items with the patient Avoid sharing household items. You should not share dishes, drinking glasses, cups, eating utensils, towels, bedding, or other items with a patient who is confirmed to have, or being evaluated for, COVID-19 infection. After the person uses these items, you should wash them thoroughly with soap and water.  Wash laundry thoroughly Immediately remove and wash clothes or bedding that have blood, body fluids, and/or secretions or excretions, such as sweat, saliva, sputum, nasal mucus, vomit, urine, or feces, on them. Wear gloves  when handling laundry from the patient. Read and follow directions on labels of laundry or clothing items and detergent. In general, wash and dry with the warmest temperatures recommended on the label.  Clean all areas the individual has used often Clean all touchable surfaces, such as counters, tabletops, doorknobs, bathroom fixtures, toilets, phones, keyboards, tablets, and bedside tables, every day. Also, clean any surfaces that may have blood, body fluids, and/or secretions or excretions on them. Wear gloves when cleaning surfaces the patient has come in contact with. Use a diluted bleach solution (e.g., dilute bleach with 1 part bleach and 10 parts water) or a household disinfectant with a label that says EPA-registered for coronaviruses. To make a bleach solution at home, add 1 tablespoon of bleach to 1 quart (4 cups) of water. For a larger supply, add  cup of bleach to 1 gallon (16 cups) of water. Read labels of cleaning products and follow recommendations provided on product labels. Labels contain instructions for safe and effective use of the cleaning product including precautions you should take when applying the product, such as wearing gloves or eye protection and making sure you have good ventilation during use of the product. Remove gloves and wash hands immediately after cleaning.  Monitor yourself for signs and symptoms of illness Caregivers and household members are considered close contacts, should monitor their health, and will be asked to limit movement outside of the home to the extent possible. Follow the monitoring steps for close contacts listed on the symptom monitoring form.   ? If you have additional questions, contact your local health department or call the epidemiologist on call at [phone] (available 24/7). ? This guidance is subject to change. For the most up-to-date guidance from [facility], please refer to their  website: YouBlogs.pl   Increase activity slowly   Complete by: As directed      Allergies as of 02/13/2019   No Known Allergies     Medication List    TAKE these medications   acetaminophen 325 MG tablet Commonly known as: TYLENOL Take 650 mg by mouth every 6 (six) hours as needed for mild pain, fever or headache.   omeprazole 20 MG capsule Commonly known as: PRILOSEC Take 20 mg by mouth daily.   Oxycodone HCl 10 MG Tabs Take 10 mg by mouth 2 (two) times daily.   predniSONE 10 MG tablet Commonly known as: DELTASONE Take 40 mg p.o. daily for 3 days, then, continue usual dosing of 30 mg p.o. daily. What changed:   how much to take  how to take this  when to  take this  additional instructions   pyridostigmine 60 MG tablet Commonly known as: MESTINON Take 60 mg by mouth 3 (three) times daily.      Follow-up Information    Rogers Blocker, MD. Schedule an appointment as soon as possible for a visit in 1 week(s).   Specialty: Internal Medicine Contact information: [address] [place] [address] [phone]          No Known Allergies  Consultations:   None   Other Procedures/Studies: Dg Chest 2 View  Result Date: 02/07/2019 CLINICAL DATA:  Fever, cough EXAM: CHEST - 2 VIEW COMPARISON:  None. FINDINGS: Cardiomegaly. Both lungs are clear. Pectus deformity. The visualized skeletal structures are otherwise unremarkable. IMPRESSION: Cardiomegaly without acute abnormality of the lungs. Electronically Signed   By: Eddie Candle M.D.   On: 02/07/2019 17:20   Dg Chest Port 1 View  Result Date: 02/09/2019 CLINICAL DATA:  Shortness of breath. COVID-19 positive last Friday. Febrile. EXAM: PORTABLE CHEST 1 VIEW COMPARISON:  Chest x-rays dated 02/07/2019. FINDINGS: Stable cardiomegaly. Subtle opacities within the LEFT lower lung and streaky opacities at the RIGHT lung base, suspicious for  multifocal pneumonia. No pleural effusion or pneumothorax seen. Osseous structures about the chest are unremarkable. IMPRESSION: 1. Subtle opacities within the LEFT lower lung and streaky opacities at the RIGHT lung base, suspicious for multifocal pneumonia. 2. Stable cardiomegaly. Electronically Signed   By: Franki Cabot M.D.   On: 02/09/2019 17:06     TODAY-DAY OF DISCHARGE:  Subjective:   Russell Green today has no headache,no chest abdominal pain,no new weakness tingling or numbness, feels much better wants to go home today.   Objective:   Blood pressure 120/81, pulse 61, temperature (!) 97.3 F (36.3 C), temperature source Oral, resp. rate 14, height 6' 1"  (1.854 m), weight 118 kg, SpO2 95 %.  Intake/Output Summary (Last 24 hours) at 02/13/2019 1032 Last data filed at 02/13/2019 1000 Gross per 24 hour  Intake 2550.14 ml  Output 3 ml  Net 2547.14 ml   Filed Weights   02/09/19 1701 02/09/19 2337  Weight: 117.9 kg 118 kg    Exam: Awake Alert, Oriented *3, No new F.N deficits, Normal affect [place].AT,PERRAL Supple Neck,No JVD, No cervical lymphadenopathy appriciated.  Symmetrical Chest wall movement, Good air movement bilaterally, CTAB RRR,No Gallops,Rubs or new Murmurs, No Parasternal Heave +ve B.Sounds, Abd Soft, Non tender, No organomegaly appriciated, No rebound -guarding or rigidity. No Cyanosis, Clubbing or edema, No new Rash or bruise   PERTINENT RADIOLOGIC STUDIES: Dg Chest 2 View  Result Date: 02/07/2019 CLINICAL DATA:  Fever, cough EXAM: CHEST - 2 VIEW COMPARISON:  None. FINDINGS: Cardiomegaly. Both lungs are clear. Pectus deformity. The visualized skeletal structures are otherwise unremarkable. IMPRESSION: Cardiomegaly without acute abnormality of the lungs. Electronically Signed   By: Eddie Candle M.D.   On: 02/07/2019 17:20   Dg Chest Port 1 View  Result Date: 02/09/2019 CLINICAL DATA:  Shortness of breath. COVID-19 positive last Friday. Febrile. EXAM: PORTABLE  CHEST 1 VIEW COMPARISON:  Chest x-rays dated 02/07/2019. FINDINGS: Stable cardiomegaly. Subtle opacities within the LEFT lower lung and streaky opacities at the RIGHT lung base, suspicious for multifocal pneumonia. No pleural effusion or pneumothorax seen. Osseous structures about the chest are unremarkable. IMPRESSION: 1. Subtle opacities within the LEFT lower lung and streaky opacities at the RIGHT lung base, suspicious for multifocal pneumonia. 2. Stable cardiomegaly. Electronically Signed   By: Franki Cabot M.D.   On: 02/09/2019 17:06  PERTINENT LAB RESULTS: CBC: Recent Labs    02/12/19 0400 02/13/19 0430  WBC 5.0 4.5  HGB 15.4 15.3  HCT 46.7 46.7  PLT 175 178   CMET CMP     Component Value Date/Time   NA 137 02/13/2019 0430   K 4.2 02/13/2019 0430   CL 98 02/13/2019 0430   CO2 26 02/13/2019 0430   GLUCOSE 121 (H) 02/13/2019 0430   BUN 18 02/13/2019 0430   CREATININE 0.95 02/13/2019 0430   CALCIUM 8.6 (L) 02/13/2019 0430   PROT 6.7 02/13/2019 0430   ALBUMIN 3.6 02/13/2019 0430   AST 48 (H) 02/13/2019 0430   ALT 48 (H) 02/13/2019 0430   ALKPHOS 39 02/13/2019 0430   BILITOT 0.9 02/13/2019 0430   GFRNONAA >60 02/13/2019 0430   GFRAA >60 02/13/2019 0430    GFR Estimated Creatinine Clearance: 129.3 mL/min (by C-G formula based on SCr of 0.95 mg/dL). No results for input(s): LIPASE, AMYLASE in the last 72 hours. No results for input(s): CKTOTAL, CKMB, CKMBINDEX, TROPONINI in the last 72 hours. Invalid input(s): POCBNP Recent Labs    02/12/19 0400 02/13/19 0430  DDIMER 0.68* 0.49   No results for input(s): HGBA1C in the last 72 hours. No results for input(s): CHOL, HDL, LDLCALC, TRIG, CHOLHDL, LDLDIRECT in the last 72 hours. No results for input(s): TSH, T4TOTAL, T3FREE, THYROIDAB in the last 72 hours.  Invalid input(s): FREET3 Recent Labs    02/12/19 0400 02/13/19 0430  FERRITIN 388* 360*   Coags: No results for input(s): INR in the last 72  hours.  Invalid input(s): PT Microbiology: Recent Results (from the past 240 hour(s))  Novel Coronavirus, NAA (Hosp order, Send-out to Ref Lab; TAT 18-24 hrs     Status: Abnormal   Collection Time: 02/07/19  4:36 PM   Specimen: Nasal Swab; Respiratory  Result Value Ref Range Status   SARS-CoV-2, NAA DETECTED (A) NOT DETECTED Final    Comment: RESULT CALLED TO, READ BACK BY AND VERIFIED WITH: L BERDIK [id number] AT 655 AM BY CM (NOTE)                  Client Requested Flag This nucleic acid amplification test was developed and its performance characteristics determined by [employer]. Nucleic acid amplification tests include PCR and TMA. This test has not been FDA cleared or approved. This test has been authorized by FDA under an Emergency Use Authorization (EUA). This test is only authorized for the duration of time the declaration that circumstances exist justifying the authorization of the emergency use of in vitro diagnostic tests for detection of SARS-CoV-2 virus and/or diagnosis of COVID-19 infection under section 564(b)(1) of the Act, 21 U.S.C. 938BOF-7(P) (1), unless the authorization is terminated or revoked sooner. When diagnostic testing is negative, the possibility of a false negative result should be considered in the context of a patient's recent exposures and the presence of clinical sign s and symptoms consistent with COVID-19. An individual without symptoms of COVID- 19 and who is not shedding SARS-CoV-2 virus would expect to have a negative (not detected) result in this assay. Performed At: [facility] [address] [place], [place] [id number] Rush Farmer MD OE:[id number]    [place]  Final    Comment: Performed at [facility], [place] [address] [place], [place] [address]  Culture, blood (Routine x 2)     Status: None (Preliminary result)   Collection Time: 02/09/19  3:49 PM   Specimen: BLOOD  Result  Value Ref  Range Status   Specimen Description BLOOD LEFT ANTECUBITAL  Final   Special Requests   Final    BOTTLES DRAWN AEROBIC AND ANAEROBIC Blood Culture adequate volume   Culture   Final    NO GROWTH 4 DAYS Performed at [facility], [address], [place], [place] [address]    Report Status PENDING  Incomplete  Culture, blood (Routine x 2)     Status: None (Preliminary result)   Collection Time: 02/09/19  5:57 PM   Specimen: BLOOD RIGHT ARM  Result Value Ref Range Status   Specimen Description BLOOD RIGHT ARM  Final   Special Requests   Final    BOTTLES DRAWN AEROBIC AND ANAEROBIC Blood Culture results may not be optimal due to an inadequate volume of blood received in culture bottles   Culture   Final    NO GROWTH 4 DAYS Performed at [facility], [place] [address] [place], [place] [address]    Report Status PENDING  Incomplete    FURTHER DISCHARGE INSTRUCTIONS:  Get Medicines reviewed and adjusted: Please take all your medications with you for your next visit with your Primary MD  Laboratory/radiological data: Please request your Primary MD to go over all hospital tests and procedure/radiological results at the follow up, please ask your Primary MD to get all Hospital records sent to his/her office.  In some cases, they will be blood work, cultures and biopsy results pending at the time of your discharge. Please request that your primary care M.D. goes through all the records of your hospital data and follows up on these results.  Also Note the following: If you experience worsening of your admission symptoms, develop shortness of breath, life threatening emergency, suicidal or homicidal thoughts you must seek medical attention immediately by calling 911 or calling your MD immediately  if symptoms less severe.  You must read complete instructions/literature along with all the possible adverse reactions/side effects for all the Medicines you take and that have been  prescribed to you. Take any new Medicines after you have completely understood and accpet all the possible adverse reactions/side effects.   Do not drive when taking Pain medications or sleeping medications (Benzodaizepines)  Do not take more than prescribed Pain, Sleep and Anxiety Medications. It is not advisable to combine anxiety,sleep and pain medications without talking with your primary care practitioner  Special Instructions: If you have smoked or chewed Tobacco  in the last 2 yrs please stop smoking, stop any regular Alcohol  and or any Recreational drug use.  Wear Seat belts while driving.  Please note: You were cared for by a hospitalist during your hospital stay. Once you are discharged, your primary care physician will handle any further medical issues. Please note that NO REFILLS for any discharge medications will be authorized once you are discharged, as it is imperative that you return to your primary care physician (or establish a relationship with a primary care physician if you do not have one) for your post hospital discharge needs so that they can reassess your need for medications and monitor your lab values.  Total Time spent coordinating discharge including counseling, education and face to face time equals 35 minutes.  SignedOren Binet 02/13/2019 10:32 AM

## 2019-02-13 NOTE — Plan of Care (Signed)
Pt slept well during the night. No complaints of pain verbalized. Alert and oriented. Vitals stable on RA. Ambulatory in the hallways and independent with ADLs.  IV SL. Tolerated intermittent prone positioning overnight. Possible discharge today. No other issues, will monitor.   Problem: Education: Goal: Knowledge of risk factors and measures for prevention of condition will improve Outcome: Progressing   Problem: Coping: Goal: Psychosocial and spiritual needs will be supported Outcome: Progressing   Problem: Respiratory: Goal: Will maintain a patent airway Outcome: Progressing Goal: Complications related to the disease process, condition or treatment will be avoided or minimized Outcome: Progressing   Problem: Clinical Measurements: Goal: Ability to maintain clinical measurements within normal limits will improve Outcome: Progressing Goal: Respiratory complications will improve Outcome: Progressing Goal: Cardiovascular complication will be avoided Outcome: Progressing   Problem: Activity: Goal: Risk for activity intolerance will decrease Outcome: Progressing

## 2019-02-14 LAB — CULTURE, BLOOD (ROUTINE X 2)
Culture: NO GROWTH
Culture: NO GROWTH
Special Requests: ADEQUATE

## 2019-11-17 ENCOUNTER — Ambulatory Visit (HOSPITAL_COMMUNITY)
Admission: EM | Admit: 2019-11-17 | Discharge: 2019-11-17 | Disposition: A | Discharge status: Home / Self Care | Payer: Medicaid Other | Attending: Internal Medicine | Admitting: Internal Medicine

## 2019-11-17 ENCOUNTER — Encounter (HOSPITAL_COMMUNITY): Payer: Self-pay | Admitting: Emergency Medicine

## 2019-11-17 ENCOUNTER — Other Ambulatory Visit: Payer: Self-pay

## 2019-11-17 DIAGNOSIS — Z20822 Contact with and (suspected) exposure to covid-19: Secondary | ICD-10-CM | POA: Insufficient documentation

## 2019-11-17 DIAGNOSIS — R0981 Nasal congestion: Secondary | ICD-10-CM | POA: Insufficient documentation

## 2019-11-17 NOTE — ED Provider Notes (Signed)
[facility]    CSN: [id number] Arrival date & time: 11/17/19  1334      History   Chief Complaint Chief Complaint  Patient presents with  . Nasal Congestion  . Nausea    HPI Russell Green is a 49 y.o. male.   Pt is a 49 year old male that presents with nasal congestion, loss of appetite, chills, congestion for 3 to 4 days.  Has been using Tylenol as needed today.  Patient concerned and anxious due to previously being diagnosed with Covid back in October.  He was admitted to the hospital at this time.  Mild chest tightness but no cough.  Wife has been sick with similar symptoms  ROS per HPI      Past Medical History:  Diagnosis Date  . Myasthenia gravis ([place]) .    Patient Active Problem List   Diagnosis Date Noted  . COVID-19 02/09/2019  . AKI (acute kidney injury) ([place]) 02/09/2019  . MG (myasthenia gravis) ([place]) 02/09/2019    History reviewed. No pertinent surgical history.     Home Medications    Prior to Admission medications   Medication Sig Start Date End Date Taking? Authorizing Provider  omeprazole (PRILOSEC) 20 MG capsule Take 20 mg by mouth daily.   Yes [provider]  Oxycodone HCl 10 MG TABS Take 10 mg by mouth 2 (two) times daily.   Yes [provider]  predniSONE (DELTASONE) 10 MG tablet Take 40 mg p.o. daily for 3 days, then, continue usual dosing of 30 mg p.o. daily. 02/13/19  Yes Ghimire, Henreitta Leber, MD  pyridostigmine (MESTINON) 60 MG tablet Take 60 mg by mouth 3 (three) times daily.   Yes [provider]  acetaminophen (TYLENOL) 325 MG tablet Take 650 mg by mouth every 6 (six) hours as needed for mild pain, fever or headache.    [provider]    Family History No family history on file.  Social History Social History   Tobacco Use  . Smoking status: Never Smoker  . Smokeless tobacco: Never Used  Substance Use Topics  . Alcohol use: No  . Drug use: No     Allergies   Patient has no  known allergies.   Review of Systems Review of Systems   Physical Exam Triage Vital Signs ED Triage Vitals  Enc Vitals Group     BP 11/17/19 1509 122/85     Pulse Rate 11/17/19 1508 87     Resp 11/17/19 1508 16     Temp 11/17/19 1508 98.8 F (37.1 C)     Temp Source 11/17/19 1508 Oral     SpO2 11/17/19 1508 99 %     Weight --      Height --      Head Circumference --      Peak Flow --      Pain Score 11/17/19 1503 7     Pain Loc --      Pain Edu? --      Excl. in [place]? --    No data found.  Updated Vital Signs BP 122/85   Pulse 87   Temp 98.8 F (37.1 C) (Oral)   Resp 16   SpO2 99%   Visual Acuity Right Eye Distance:   Left Eye Distance:   Bilateral Distance:    Right Eye Near:   Left Eye Near:    Bilateral Near:     Physical Exam Vitals and nursing note reviewed.  Constitutional:  General: He is not in acute distress.    Appearance: Normal appearance. He is not ill-appearing, toxic-appearing or diaphoretic.  HENT:     Head: Normocephalic and atraumatic.     Nose: Nose normal.  Eyes:     Conjunctiva/sclera: Conjunctivae normal.  Cardiovascular:     Rate and Rhythm: Normal rate and regular rhythm.  Pulmonary:     Effort: Pulmonary effort is normal.     Breath sounds: Normal breath sounds.  Musculoskeletal:        General: Normal range of motion.     Cervical back: Normal range of motion.  Skin:    General: Skin is warm and dry.  Neurological:     Mental Status: He is alert.  Psychiatric:        Mood and Affect: Mood normal.      UC Treatments / Results  Labs (all labs ordered are listed, but only abnormal results are displayed) Labs Reviewed  SARS CORONAVIRUS 2 (TAT 6-24 HRS)    EKG   Radiology No results found.  Procedures Procedures (including critical care time)  Medications Ordered in UC Medications - No data to display  Initial Impression / Assessment and Plan / UC Course  I have reviewed the triage vital signs and  the nursing notes.  Pertinent labs & imaging results that were available during my care of the patient were reviewed by me and considered in my medical decision making (see chart for details).     Viral symptoms Covid swab pending. Nothing concerning on exam. Over-the-counter medicines as needed Follow up as needed for continued or worsening symptoms  Final Clinical Impressions(s) / UC Diagnoses   Final diagnoses:  Nasal congestion     Discharge Instructions     Covid swab is pending Follow up as needed for continued or worsening symptoms     ED Prescriptions    None     PDMP not reviewed this encounter.   Orvan July, NP 11/17/19 1620

## 2019-11-17 NOTE — Discharge Instructions (Addendum)
Covid swab is pending Follow up as needed for continued or worsening symptoms

## 2019-11-17 NOTE — ED Triage Notes (Signed)
Headache, nausea, chills, congestion for 3-4 days.   Used tylenol and zinc today.   He had COVID a few months ago and was admitted to [facility], states this feels same.

## 2019-11-18 LAB — SARS CORONAVIRUS 2 (TAT 6-24 HRS): SARS Coronavirus 2: NEGATIVE

## 2019-12-21 ENCOUNTER — Encounter (INDEPENDENT_AMBULATORY_CARE_PROVIDER_SITE_OTHER): Payer: Self-pay

## 2019-12-21 DIAGNOSIS — Z0289 Encounter for other administrative examinations: Secondary | ICD-10-CM | POA: Insufficient documentation

## 2020-06-29 ENCOUNTER — Ambulatory Visit (INDEPENDENT_AMBULATORY_CARE_PROVIDER_SITE_OTHER): Payer: 59 | Admitting: Internal Medicine

## 2020-06-29 ENCOUNTER — Encounter: Payer: Self-pay | Admitting: Internal Medicine

## 2020-06-29 ENCOUNTER — Other Ambulatory Visit: Payer: Self-pay

## 2020-06-29 VITALS — BP 124/86 | HR 91 | Temp 98.6°F | Resp 16 | Ht 73.0 in | Wt 263.0 lb

## 2020-06-29 DIAGNOSIS — R9431 Abnormal electrocardiogram [ECG] [EKG]: Secondary | ICD-10-CM | POA: Insufficient documentation

## 2020-06-29 DIAGNOSIS — R0600 Dyspnea, unspecified: Secondary | ICD-10-CM

## 2020-06-29 DIAGNOSIS — R7989 Other specified abnormal findings of blood chemistry: Secondary | ICD-10-CM | POA: Insufficient documentation

## 2020-06-29 DIAGNOSIS — R739 Hyperglycemia, unspecified: Secondary | ICD-10-CM | POA: Insufficient documentation

## 2020-06-29 DIAGNOSIS — M5441 Lumbago with sciatica, right side: Secondary | ICD-10-CM | POA: Diagnosis not present

## 2020-06-29 DIAGNOSIS — M1712 Unilateral primary osteoarthritis, left knee: Secondary | ICD-10-CM | POA: Insufficient documentation

## 2020-06-29 DIAGNOSIS — G7000 Myasthenia gravis without (acute) exacerbation: Secondary | ICD-10-CM | POA: Diagnosis not present

## 2020-06-29 DIAGNOSIS — Z0001 Encounter for general adult medical examination with abnormal findings: Secondary | ICD-10-CM

## 2020-06-29 DIAGNOSIS — G8929 Other chronic pain: Secondary | ICD-10-CM | POA: Insufficient documentation

## 2020-06-29 DIAGNOSIS — Z0000 Encounter for general adult medical examination without abnormal findings: Secondary | ICD-10-CM

## 2020-06-29 DIAGNOSIS — Z1211 Encounter for screening for malignant neoplasm of colon: Secondary | ICD-10-CM

## 2020-06-29 DIAGNOSIS — M5442 Lumbago with sciatica, left side: Secondary | ICD-10-CM

## 2020-06-29 DIAGNOSIS — R0609 Other forms of dyspnea: Secondary | ICD-10-CM | POA: Insufficient documentation

## 2020-06-29 LAB — CBC WITH DIFFERENTIAL/PLATELET
Basophils Absolute: 0 10*3/uL (ref 0.0–0.1)
Basophils Relative: 0.7 % (ref 0.0–3.0)
Eosinophils Absolute: 0 10*3/uL (ref 0.0–0.7)
Eosinophils Relative: 0.7 % (ref 0.0–5.0)
HCT: 45 % (ref 39.0–52.0)
Hemoglobin: 15 g/dL (ref 13.0–17.0)
Lymphocytes Relative: 18.4 % (ref 12.0–46.0)
Lymphs Abs: 1.2 10*3/uL (ref 0.7–4.0)
MCHC: 33.2 g/dL (ref 30.0–36.0)
MCV: 89 fl (ref 78.0–100.0)
Monocytes Absolute: 0.5 10*3/uL (ref 0.1–1.0)
Monocytes Relative: 7 % (ref 3.0–12.0)
Neutro Abs: 4.7 10*3/uL (ref 1.4–7.7)
Neutrophils Relative %: 73.2 % (ref 43.0–77.0)
Platelets: 208 10*3/uL (ref 150.0–400.0)
RBC: 5.06 Mil/uL (ref 4.22–5.81)
RDW: 13.6 % (ref 11.5–15.5)
WBC: 6.5 10*3/uL (ref 4.0–10.5)

## 2020-06-29 LAB — PROTIME-INR
INR: 1.1 ratio — ABNORMAL HIGH (ref 0.8–1.0)
Prothrombin Time: 12.2 s (ref 9.6–13.1)

## 2020-06-29 LAB — BASIC METABOLIC PANEL
BUN: 19 mg/dL (ref 6–23)
CO2: 34 mEq/L — ABNORMAL HIGH (ref 19–32)
Calcium: 9.5 mg/dL (ref 8.4–10.5)
Chloride: 102 mEq/L (ref 96–112)
Creatinine, Ser: 1.28 mg/dL (ref 0.40–1.50)
GFR: 66.11 mL/min (ref >60.00)
Glucose, Bld: 74 mg/dL (ref 70–99)
Potassium: 4.1 mEq/L (ref 3.5–5.1)
Sodium: 140 mEq/L (ref 135–145)

## 2020-06-29 LAB — HEPATIC FUNCTION PANEL
ALT: 24 U/L (ref 0–53)
AST: 23 U/L (ref 0–37)
Albumin: 4 g/dL (ref 3.5–5.2)
Alkaline Phosphatase: 41 U/L (ref 39–117)
Bilirubin, Direct: 0.1 mg/dL (ref 0.0–0.3)
Total Bilirubin: 0.7 mg/dL (ref 0.2–1.2)
Total Protein: 6.4 g/dL (ref 6.0–8.3)

## 2020-06-29 LAB — LIPID PANEL
Cholesterol: 218 mg/dL — ABNORMAL HIGH (ref 0–200)
HDL: 83.9 mg/dL (ref >39.00)
LDL Cholesterol: 109 mg/dL — ABNORMAL HIGH (ref 0–99)
NonHDL: 134.26
Total CHOL/HDL Ratio: 3
Triglycerides: 127 mg/dL (ref 0.0–149.0)
VLDL: 25.4 mg/dL (ref 0.0–40.0)

## 2020-06-29 LAB — TSH: TSH: 2.04 u[IU]/mL (ref 0.35–4.50)

## 2020-06-29 LAB — PSA: PSA: 1.12 ng/mL (ref 0.10–4.00)

## 2020-06-29 LAB — TROPONIN I (HIGH SENSITIVITY): High Sens Troponin I: 10 ng/L (ref 2–17)

## 2020-06-29 LAB — BRAIN NATRIURETIC PEPTIDE: Pro B Natriuretic peptide (BNP): 25 pg/mL (ref 0.0–100.0)

## 2020-06-29 LAB — HEMOGLOBIN A1C: Hgb A1c MFr Bld: 6.3 % (ref 4.6–6.5)

## 2020-06-29 NOTE — Patient Instructions (Signed)

## 2020-06-29 NOTE — Progress Notes (Signed)
Subjective:  Patient ID: Russell Green, male    DOB: January 17, 1971  Age: 50 y.o. MRN: [id number]  CC: Annual Exam  This visit occurred during the SARS-CoV-2 public health emergency.  Safety protocols were in place, including screening questions prior to the visit, additional usage of staff PPE, and extensive cleaning of exam room while observing appropriate contact time as indicated for disinfecting solutions.    HPI Russell Green presents for a CPX.  He has a complex medical history.  About 11 years ago he describes living in [place] and says he developed hyperthyroidism and myasthenia gravis.  He tells me the A. fib was treated with ablation.  The hyperthyroidism was treated with radioactive iodine.  He is not currently taking a thyroid supplement and does not have any lingering symptoms of thyroid disease.  He complains of a 97-monthhistory of dyspnea on exertion and exertional chest pain that he describes as an aching sensation.  He denies diaphoresis, dizziness, lightheadedness, palpitations, syncope, or near syncope.  He has a history of elevated liver enzymes.  History Russell Green has a past medical history of Asthma, Atrial fibrillation, transient ([place], Myasthenia gravis ([place] (.), and Thyroid disease.   He has a past surgical history that includes [place] tooth extraction.   His family history includes Cancer in his mother; Healthy in his brother and sister.He reports that he has never smoked. He has never used smokeless tobacco. He reports that he does not drink alcohol and does not use drugs.  Outpatient Medications Prior to Visit  Medication Sig Dispense Refill  . acetaminophen (TYLENOL) 325 MG tablet Take 650 mg by mouth every 6 (six) hours as needed for mild pain, fever or headache.    .Marland Kitchenomeprazole (PRILOSEC) 20 MG capsule Take 20 mg by mouth daily.    . Oxycodone HCl 10 MG TABS Take 10 mg by mouth 2 (two) times daily.    . predniSONE (DELTASONE) 10 MG tablet Take 40 mg p.o. daily for 3  days, then, continue usual dosing of 30 mg p.o. daily. 60 tablet 0  . pyridostigmine (MESTINON) 60 MG tablet Take 60 mg by mouth 3 (three) times daily.     No facility-administered medications prior to visit.    ROS Review of Systems  Constitutional: Negative for appetite change, chills, diaphoresis, fatigue and fever.  HENT: Negative.   Eyes: Negative.   Respiratory: Positive for shortness of breath. Negative for cough, chest tightness, wheezing and stridor.   Cardiovascular: Positive for chest pain. Negative for palpitations and leg swelling.  Gastrointestinal: Negative for abdominal pain, constipation, diarrhea, nausea and vomiting.  Endocrine: Negative for cold intolerance and heat intolerance.  Genitourinary: Negative.  Negative for difficulty urinating.  Musculoskeletal: Positive for arthralgias and back pain. Negative for myalgias and neck pain.  Skin: Negative.  Negative for color change and pallor.  Neurological: Negative.  Negative for dizziness, weakness, light-headedness and numbness.  Hematological: Negative for adenopathy. Does not bruise/bleed easily.  Psychiatric/Behavioral: Negative.     Objective:  BP 124/86   Pulse 91   Temp 98.6 F (37 C) (Oral)   Resp 16   Ht 6' 1"  (1.854 m)   Wt 263 lb (119.3 kg)   SpO2 95%   BMI 34.70 kg/m   Physical Exam Vitals reviewed.  Constitutional:      Appearance: Normal appearance.  HENT:     Nose: Nose normal.     Mouth/Throat:     Mouth: Mucous membranes are moist.  Eyes:  General: No scleral icterus.    Conjunctiva/sclera: Conjunctivae normal.  Cardiovascular:     Rate and Rhythm: Normal rate and regular rhythm.     Heart sounds: Normal heart sounds, S1 normal and S2 normal. No murmur heard. No friction rub. No gallop.      Comments: EKG- NSR, 82 bpm Low voltage and TWI in V1  TWI in III No Q waves  No old EKG's for comparison Pulmonary:     Effort: Pulmonary effort is normal.     Breath sounds: No  stridor. No wheezing, rhonchi or rales.  Abdominal:     General: Abdomen is protuberant. Bowel sounds are normal. There is no distension.     Palpations: Abdomen is soft. There is no hepatomegaly, splenomegaly or mass.     Tenderness: There is no abdominal tenderness.     Hernia: No hernia is present. There is no hernia in the left inguinal area or right inguinal area.  Genitourinary:    Pubic Area: No rash.      Penis: Normal.      Testes: Normal.     Epididymis:     Right: Normal. Not enlarged. No mass.     Left: Normal. Not enlarged. No mass.  Musculoskeletal:        General: No swelling. Normal range of motion.     Cervical back: Neck supple.     Right lower leg: No edema.     Left lower leg: No edema.  Lymphadenopathy:     Cervical: No cervical adenopathy.     Lower Body: No right inguinal adenopathy. No left inguinal adenopathy.  Skin:    General: Skin is warm and dry.     Coloration: Skin is not pale.  Neurological:     General: No focal deficit present.     Mental Status: He is alert and oriented to person, place, and time. Mental status is at baseline.  Psychiatric:        Mood and Affect: Mood normal.        Behavior: Behavior normal.     Lab Results  Component Value Date   WBC 6.5 06/29/2020   HGB 15.0 06/29/2020   HCT 45.0 06/29/2020   PLT 208.0 06/29/2020   GLUCOSE 74 06/29/2020   CHOL 218 (H) 06/29/2020   TRIG 127.0 06/29/2020   HDL 83.90 06/29/2020   LDLCALC 109 (H) 06/29/2020   ALT 24 06/29/2020   AST 23 06/29/2020   NA 140 06/29/2020   K 4.1 06/29/2020   CL 102 06/29/2020   CREATININE 1.28 06/29/2020   BUN 19 06/29/2020   CO2 34 (H) 06/29/2020   TSH 2.04 06/29/2020   PSA 1.12 06/29/2020   INR 1.1 (H) 06/29/2020   HGBA1C 6.3 06/29/2020    Assessment & Plan:   Russell Green was seen today for annual exam.  Diagnoses and all orders for this visit:  Encounter for general adult medical examination with abnormal findings- Exam completed, labs  reviewed, vaccines reviewed -he refused vaccines against tetanus and influenza, cancer screenings addressed, patient education material was given. -     Lipid panel; Future -     HIV Antibody (routine testing w rflx); Future -     PSA; Future -     PSA -     HIV Antibody (routine testing w rflx) -     Lipid panel  Hyperglycemia- His A1c is at 6.3%.  He is prediabetic.  I encouraged him to improve his lifestyle modifications. -  Basic metabolic panel; Future -     Hemoglobin A1c; Future -     Hemoglobin A1c -     Basic metabolic panel  Elevated LFTs- His LFTs are normal now.  I Russell screen him for viral hepatitis. -     Hepatic function panel; Future -     Hepatitis B surface antibody,quantitative; Future -     Hepatitis B surface antigen; Future -     Hepatitis C antibody; Future -     Hepatitis B core antibody, total; Future -     Hepatitis A antibody, total; Future -     Protime-INR; Future -     Protime-INR -     Hepatitis A antibody, total -     Hepatitis B core antibody, total -     Hepatitis C antibody -     Hepatitis B surface antigen -     Hepatitis B surface antibody,quantitative -     Hepatic function panel  DOE (dyspnea on exertion)- His chest pain is not consistent with ischemia.  His labs are reassuring.  I have asked him to establish with a local cardiologist. -     CBC with Differential/Platelet; Future -     Troponin I (High Sensitivity); Future -     Brain natriuretic peptide; Future -     Ambulatory referral to Cardiology -     Brain natriuretic peptide -     Troponin I (High Sensitivity) -     CBC with Differential/Platelet  Chronic midline low back pain with bilateral sciatica -     Ambulatory referral to Orthopedic Surgery  Primary osteoarthritis of left knee -     Ambulatory referral to Pain Clinic  MG (myasthenia gravis) ([place]) -     Ambulatory referral to Neurology -     TSH; Future -     TSH  Abnormal electrocardiogram (ECG) (EKG) -      Ambulatory referral to Cardiology  Colon cancer screening -     Cologuard   I am having Terrie Eudy maintain his pyridostigmine, omeprazole, Oxycodone HCl, acetaminophen, and predniSONE.  No orders of the defined types were placed in this encounter.    Follow-up: Return in about 3 months (around 09/26/2020).  Scarlette Calico, MD

## 2020-07-01 LAB — HEPATITIS C ANTIBODY
Hepatitis C Ab: NONREACTIVE
SIGNAL TO CUT-OFF: 0.01 (ref <1.00)

## 2020-07-01 LAB — HEPATITIS A ANTIBODY, TOTAL: Hepatitis A AB,Total: NONREACTIVE

## 2020-07-01 LAB — HEPATITIS B SURFACE ANTIBODY, QUANTITATIVE: Hep B S AB Quant (Post): >1000 m[IU]/mL (ref >=10)

## 2020-07-01 LAB — HEPATITIS B SURFACE ANTIGEN: Hepatitis B Surface Ag: NONREACTIVE

## 2020-07-01 LAB — HIV ANTIBODY (ROUTINE TESTING W REFLEX): HIV 1&2 Ab, 4th Generation: NONREACTIVE

## 2020-07-01 LAB — HEPATITIS B CORE ANTIBODY, TOTAL: Hep B Core Total Ab: NONREACTIVE

## 2020-07-05 ENCOUNTER — Ambulatory Visit: Payer: 59 | Admitting: Orthopaedic Surgery

## 2020-07-05 ENCOUNTER — Encounter: Payer: Self-pay | Admitting: Cardiology

## 2020-07-05 ENCOUNTER — Other Ambulatory Visit: Payer: Self-pay

## 2020-07-05 ENCOUNTER — Ambulatory Visit: Payer: 59 | Admitting: Cardiology

## 2020-07-05 VITALS — BP 121/87 | HR 83 | Temp 98.2°F | Resp 16 | Ht 73.0 in | Wt 258.4 lb

## 2020-07-05 DIAGNOSIS — R072 Precordial pain: Secondary | ICD-10-CM

## 2020-07-05 DIAGNOSIS — Z87891 Personal history of nicotine dependence: Secondary | ICD-10-CM

## 2020-07-05 DIAGNOSIS — E6609 Other obesity due to excess calories: Secondary | ICD-10-CM

## 2020-07-05 DIAGNOSIS — Z8639 Personal history of other endocrine, nutritional and metabolic disease: Secondary | ICD-10-CM

## 2020-07-05 DIAGNOSIS — R9431 Abnormal electrocardiogram [ECG] [EKG]: Secondary | ICD-10-CM

## 2020-07-05 DIAGNOSIS — Z8679 Personal history of other diseases of the circulatory system: Secondary | ICD-10-CM

## 2020-07-05 DIAGNOSIS — R0609 Other forms of dyspnea: Secondary | ICD-10-CM

## 2020-07-05 DIAGNOSIS — R0600 Dyspnea, unspecified: Secondary | ICD-10-CM

## 2020-07-05 MED ORDER — METOPROLOL TARTRATE 25 MG PO TABS: 25.0000 mg | ORAL_TABLET | Freq: Two times a day (BID) | ORAL | 3 refills | Status: DC

## 2020-07-05 NOTE — Progress Notes (Signed)
Date:  07/05/2020   ID:  Russell [name], DOB [birth year], MRN [id number]  PCP:  Russell Lima, MD  Cardiologist:  Russell Kras, DO, [facility] (established care 07/05/2020)  REASON FOR CONSULT: Dyspnea on exertion and abnormal EKG  REQUESTING PHYSICIAN:  Russell Lima, MD [address] [place],  [place] [address]  Chief Complaint  Patient presents with  . DOE    (dyspnea on exertion)  . Abnormal ECG  . New Patient (Initial Visit)    HPI  Russell [name] is a 50 y.o. male who presents to the office with a chief complaint of " chest pain and shortness of breath." Patient's past medical history and cardiovascular risk factors include: history of hyperthyroidism, myasthenia gravis, history of atrial fibrillation status post ablation, prediabetes, obesity due to excess calories, former smoker.  He is referred to the office at the request of Russell Lima, MD for evaluation of dyspnea on exertion and abnormal EKG.  Chest Pain and Shortness of breath:  Patient states that the shortness of breath has been present for the last 1 year but progressive.  He experiences it on a daily basis especially when he does his daily chores around the house.  The symptoms usually improve after resting.  He denies orthopnea, paroxysmal nocturnal dyspnea, lower extremity swelling.  He denies any significant weight gain.  At times patient states that he could be resting and he just feels his heart racing.  Chest pain: Started approximately 3-4 months ago.  Located substernally and wraps around the inferior border of his left breast.  Pressure 5 out of 10, worse with effort related activities.  Patient states that he walks on a treadmill every day but 10 minutes into it he starts experiencing chest discomfort but it is not effort limiting.  The pain subsides when he rests.  No recent hospitalizations for cardiovascular symptoms.  Patient states that he now seeks medical attention as he just got health insurance.  No prior  history of coronary artery disease, or prior stenting.  Patient states that he was incarcerated [name] in 2012 and was found to be hyperthyroid and admitted to [facility] for further evaluation and management.  During that hospitalization he was also noted to be in atrial fibrillation for which he underwent radiofrequency ablation.  I do not have any additional details regarding this procedure.  Based on the patient's memory he was started on anticoagulation for short period of time and then later discontinued.  No family history of premature coronary disease or sudden cardiac death.  FUNCTIONAL STATUS: Treadmill every other day for about 45-6mn a day.    ALLERGIES: No Known Allergies  MEDICATION LIST PRIOR TO VISIT: Current Meds  Medication Sig  . acetaminophen (TYLENOL) 325 MG tablet Take 650 mg by mouth every 6 (six) hours as needed for mild pain, fever or headache.  . metoprolol tartrate (LOPRESSOR) 25 MG tablet Take 1 tablet (25 mg total) by mouth 2 (two) times daily.  .Marland Kitchenomeprazole (PRILOSEC) 20 MG capsule Take 20 mg by mouth daily.  . Oxycodone HCl 10 MG TABS Take 10 mg by mouth 2 (two) times daily.  . predniSONE (DELTASONE) 10 MG tablet Take 40 mg p.o. daily for 3 days, then, continue usual dosing of 30 mg p.o. daily.  .Marland Kitchenpyridostigmine (MESTINON) 60 MG tablet Take 60 mg by mouth 3 (three) times daily.     PAST MEDICAL HISTORY: Past Medical History:  Diagnosis Date  . Asthma   . Atrial  fibrillation, transient ([place])   . Myasthenia gravis ([place]) .  Marland Kitchen Thyroid disease     PAST SURGICAL HISTORY: Past Surgical History:  Procedure Laterality Date  . ABLATION    . TRACHEAL SURGERY    . WISDOM TOOTH EXTRACTION      FAMILY HISTORY: The patient family history includes Cancer in his mother; Healthy in his brother and sister.  SOCIAL HISTORY:  The patient  reports that he quit smoking about 27 years ago. His smoking use included cigarettes. He has a 2.50 pack-year smoking  history. He has never used smokeless tobacco. He reports that he does not drink alcohol and does not use drugs.  REVIEW OF SYSTEMS: Review of Systems  Constitutional: Negative for chills and fever.  HENT: Negative for hoarse voice and nosebleeds.   Eyes: Negative for discharge, double vision and pain.  Cardiovascular: Positive for dyspnea on exertion. Negative for chest pain, claudication, leg swelling, near-syncope, orthopnea, palpitations, paroxysmal nocturnal dyspnea and syncope.  Respiratory: Positive for shortness of breath. Negative for hemoptysis.   Musculoskeletal: Negative for muscle cramps and myalgias.  Gastrointestinal: Negative for abdominal pain, constipation, diarrhea, hematemesis, hematochezia, melena, nausea and vomiting.  Neurological: Negative for dizziness and light-headedness.  All other systems reviewed and are negative.   PHYSICAL EXAM: Vitals with BMI 07/05/2020 06/29/2020 11/17/2019  Height 6' 1"  6' 1"  -  Weight 258 lbs 6 oz 263 lbs -  BMI 62.8 36.62 -  Systolic 947 654 650  Diastolic 87 86 85  Pulse 83 91 87    CONSTITUTIONAL: Well-developed and well-nourished. No acute distress.  SKIN: Skin is warm and dry. No rash noted. No cyanosis. No pallor. No jaundice.  Tattoos noted on the bilateral upper extremities. HEAD: Normocephalic and atraumatic.  EYES: No scleral icterus MOUTH/THROAT: Moist oral membranes.  NECK: No JVD present. No thyromegaly noted. No carotid bruits  LYMPHATIC: No visible cervical adenopathy.  CHEST Normal respiratory effort. No intercostal retractions  LUNGS: Clear to auscultation bilaterally.  No stridor. No wheezes. No rales.  CARDIOVASCULAR: Regular rate and rhythm, positive S1-S2, no murmurs rubs or gallops appreciated. ABDOMINAL: Obese, soft, nontender, nondistended, positive bowel sounds in all 4 quadrants no apparent ascites.  EXTREMITIES: No peripheral edema  HEMATOLOGIC: No significant bruising NEUROLOGIC: Oriented to person,  place, and time. Nonfocal. Normal muscle tone.  PSYCHIATRIC: Normal mood and affect. Normal behavior. Cooperative  CARDIAC DATABASE: EKG: 07/05/2020: Normal sinus rhythm, 79 bpm, left atrial enlargement, consider possible old inferior infarct, T wave inversions in the inferior leads cannot rule out ischemia.   Echocardiogram: No results found for this or any previous visit from the past 1095 days.   Stress Testing: No results found for this or any previous visit from the past 1095 days.   Heart Catheterization: None  LABORATORY DATA: CBC Latest Ref Rng & Units 06/29/2020 02/13/2019 02/12/2019  WBC 4.0 - 10.5 K/uL 6.5 4.5 5.0  Hemoglobin 13.0 - 17.0 g/dL 15.0 15.3 15.4  Hematocrit 39.0 - 52.0 % 45.0 46.7 46.7  Platelets 150.0 - 400.0 K/uL 208.0 178 175    CMP Latest Ref Rng & Units 06/29/2020 02/13/2019 02/12/2019  Glucose 70 - 99 mg/dL 74 121(H) 116(H)  BUN 6 - 23 mg/dL 19 18 19   Creatinine 0.40 - 1.50 mg/dL 1.28 0.95 1.13  Sodium 135 - 145 mEq/L 140 137 137  Potassium 3.5 - 5.1 mEq/L 4.1 4.2 4.1  Chloride 96 - 112 mEq/L 102 98 101  CO2 19 - 32 mEq/L 34(H) 26 25  Calcium 8.4 - 10.5 mg/dL 9.5 8.6(L) 8.6(L)  Total Protein 6.0 - 8.3 g/dL 6.4 6.7 6.7  Total Bilirubin 0.2 - 1.2 mg/dL 0.7 0.9 0.8  Alkaline Phos 39 - 117 U/L 41 39 36(L)  AST 0 - 37 U/L 23 48(H) 38  ALT 0 - 53 U/L 24 48(H) 26    Lipid Panel     Component Value Date/Time   CHOL 218 (H) 06/29/2020 1407   TRIG 127.0 06/29/2020 1407   HDL 83.90 06/29/2020 1407   CHOLHDL 3 06/29/2020 1407   VLDL 25.4 06/29/2020 1407   LDLCALC 109 (H) 06/29/2020 1407    No components found for: NTPROBNP Recent Labs    06/29/20 1407  PROBNP 25.0   Recent Labs    06/29/20 1407  TSH 2.04    BMP Recent Labs    06/29/20 1407  NA 140  K 4.1  CL 102  CO2 34*  GLUCOSE 74  BUN 19  CREATININE 1.28  CALCIUM 9.5    HEMOGLOBIN A1C Lab Results  Component Value Date   HGBA1C 6.3 06/29/2020    IMPRESSION:    ICD-10-CM    1. DOE (dyspnea on exertion)  R06.00 EKG 12-Lead    PCV ECHOCARDIOGRAM COMPLETE    CT CORONARY FRACTIONAL FLOW RESERVE FLUID ANALYSIS    metoprolol tartrate (LOPRESSOR) 25 MG tablet  2. Abnormal electrocardiogram (ECG) (EKG)  R94.31 PCV ECHOCARDIOGRAM COMPLETE    CT CORONARY FRACTIONAL FLOW RESERVE FLUID ANALYSIS  3. Precordial pain  R07.2 PCV ECHOCARDIOGRAM COMPLETE    CT CORONARY MORPH W/CTA COR W/SCORE W/CA W/CM &/OR WO/CM    CT CORONARY FRACTIONAL FLOW RESERVE DATA PREP    CT CORONARY FRACTIONAL FLOW RESERVE FLUID ANALYSIS    metoprolol tartrate (LOPRESSOR) 25 MG tablet  4. History of hyperthyroidism  Z86.39   5. History of atrial fibrillation without current medication  Z86.79   6. Former smoker  Z87.891   24. Class 1 obesity due to excess calories without serious comorbidity with body mass index (BMI) of 34.0 to 34.9 in adult  E66.09    Z68.34      RECOMMENDATIONS: Russell [name] is a 50 y.o. male whose past medical history and cardiac risk factors include: history of hyperthyroidism, myasthenia gravis, history of atrial fibrillation status post ablation, prediabetes, obesity due to excess calories, former smoker.  Patient has been experiencing dyspnea on exertion for the last 1 year which is not getting progressively worse and noticeable with effort related activities and with daily chores.  He is also started to develop substernal chest pain suggestive of possible cardiac etiology.  Medications reconciled.  EKG shows normal sinus rhythm without underlying injury pattern.  But concern for possible old inferior infarct.  The shared decision was to proceed with an echocardiogram to evaluate for structural heart disease and coronary CTA with FFR to rule out obstructive CAD.  We will start Lopressor 25 mg p.o. twice daily for now until the coronary CTA is performed.  Educated on importance of improving his modifiable cardiovascular risk factors.  Patient carries a remote history of atrial  fibrillation in the setting of hyperthyroidism [name] in 2012 when he was incarcerated and then treated at [facility].  No additional records to review during today's encounter.  The patient states that he did undergo radiofrequency ablation and was placed on anticoagulation for a short period of time after the procedure.  At times patient does also experience his heart racing.  I suspect that this may be  underlying PACs/PVCs but cannot rule out subclinical atrial fibrillation.  This will need further work-up with another monitor.  However patient would like to hold off on this at the current time and complete ischemic evaluation.  Former smoker: Educated on the importance of continued smoking cessation.  FINAL MEDICATION LIST END OF ENCOUNTER: Meds ordered this encounter  Medications  . metoprolol tartrate (LOPRESSOR) 25 MG tablet    Sig: Take 1 tablet (25 mg total) by mouth 2 (two) times daily.    Dispense:  180 tablet    Refill:  3    There are no discontinued medications.   Current Outpatient Medications:  .  acetaminophen (TYLENOL) 325 MG tablet, Take 650 mg by mouth every 6 (six) hours as needed for mild pain, fever or headache., Disp: , Rfl:  .  metoprolol tartrate (LOPRESSOR) 25 MG tablet, Take 1 tablet (25 mg total) by mouth 2 (two) times daily., Disp: 180 tablet, Rfl: 3 .  omeprazole (PRILOSEC) 20 MG capsule, Take 20 mg by mouth daily., Disp: , Rfl:  .  Oxycodone HCl 10 MG TABS, Take 10 mg by mouth 2 (two) times daily., Disp: , Rfl:  .  predniSONE (DELTASONE) 10 MG tablet, Take 40 mg p.o. daily for 3 days, then, continue usual dosing of 30 mg p.o. daily., Disp: 60 tablet, Rfl: 0 .  pyridostigmine (MESTINON) 60 MG tablet, Take 60 mg by mouth 3 (three) times daily., Disp: , Rfl:   Orders Placed This Encounter  Procedures  . CT CORONARY MORPH W/CTA COR W/SCORE W/CA W/CM &/OR WO/CM  . CT CORONARY FRACTIONAL FLOW RESERVE DATA PREP  . CT CORONARY FRACTIONAL FLOW RESERVE FLUID ANALYSIS   . EKG 12-Lead  . PCV ECHOCARDIOGRAM COMPLETE   There are no Patient Instructions on file for this visit.   --Continue cardiac medications as reconciled in final medication list. --Return for Follow up, Chest pain, Dyspnea, Review test results. Or sooner if needed. --Continue follow-up with your primary care physician regarding the management of your other chronic comorbid conditions.  Patient's questions and concerns were addressed to his satisfaction. He voices understanding of the instructions provided during this encounter.   This note was created using a voice recognition software as a result there may be grammatical errors inadvertently enclosed that do not reflect the nature of this encounter. Every attempt is made to correct such errors.  Russell [name], [place], [facility]  Pager: [phone] Office: [phone]

## 2020-07-06 ENCOUNTER — Encounter: Payer: Self-pay | Admitting: Neurology

## 2020-07-06 ENCOUNTER — Ambulatory Visit (INDEPENDENT_AMBULATORY_CARE_PROVIDER_SITE_OTHER): Payer: 59 | Admitting: Neurology

## 2020-07-06 VITALS — BP 121/82 | HR 71 | Ht 73.0 in | Wt 257.0 lb

## 2020-07-06 DIAGNOSIS — G7000 Myasthenia gravis without (acute) exacerbation: Secondary | ICD-10-CM | POA: Diagnosis not present

## 2020-07-06 MED ORDER — PREDNISONE 10 MG PO TABS: 30.0000 mg | ORAL_TABLET | Freq: Every day | ORAL | 5 refills | Status: DC

## 2020-07-06 MED ORDER — PYRIDOSTIGMINE BROMIDE 60 MG PO TABS: 60.0000 mg | ORAL_TABLET | Freq: Three times a day (TID) | ORAL | 5 refills | Status: DC

## 2020-07-06 NOTE — Progress Notes (Signed)
Chief Complaint  Patient presents with  . New Patient (Initial Visit)    He is here to establish new neurology care for Myasthenia Gravis. States he was diagnosed in 2012. He is currently taking Prednisone 71m, 3 tabs QD and Mestinon 664m 1 tab TID.  Feels symptoms are overall stable on current medication. He will sometimes have double vision, especially in left eye. When he is physically active, inhalation can be laborious at times.     HISTORICAL  RuMyer Bohlmans a 50 year old male, seen in request by his primary care physician Dr. JoScarlette Calicoor evaluation of myasthenia gravis, initial evaluation was on July 06, 2020  I reviewed and summarized the referring note.  He began to develop symptoms around 2010, described difficulty chewing, significant weight loss, double vision, voice change, eventually went into respiratory failure about 18 months after his symptom onset, while he was [facility] in June 2012, [facility], [facility], was diagnosed with myasthenia gravis, he was under the care of Dr. FrAlysia Pennaeam, I have limited access to 2012 initial evaluation, per patient, his symptoms began to gradually improve following 1 round of plasma exchange  He was put on high-dose of prednisone 60 mg along with Mestinon 60 mg 3 times daily, there was discussion of long-term steroid sparing agent such as CellCept and Imuran, he worried about the side effect, also because of his social situation, he has been kept on prednisone since it started in 2012  Per patient, over the years, attempt to lower dose of prednisone below 30 mg, or lower Mestinon dose below 30 mg 3 times daily, he would have recurrent symptoms such as double vision, chewing difficulty, increased fatigue, lack of stamina, limb muscle weakness  Last visit with Dr. HoNadara Mustardas in January 2019, reported suboptimal control of his myasthenia symptoms, exertional fatigue,  limit his ability to perform tasks around [place], daily double vision, while taking prednisone 30 mg daily, Mestinon 60 mg 3 times a day, Multiple spirometric trials are performed. On best effort the following are obtained: FVC: 3.21L, FEV1: 2.50L, FEV1%: 78%. The interpretation is of moderate restriction.   Following with Dr. HoLyman Spelleretirement, he was followed by Dr.Anahit Cheluskin Mehrabyan, most recent visit was in November 2020   SIPowellas performed on the left Extensor Digitorum in 2019. The study was performed 72 hours following his last dose of pyridostigmine and while taking daily prednisone. Seventeen fiber pairs were examined and the MCD was 26.9 microSec. The Median MCD was 27.8 microSec. Normal jitter was seen in 17 of 17 (100%) pairs, increased jitter without impulse blocking was seen in 0 of 17 (0%) pairs, and impulse blocking was not observed (0%).   Single Fiber EMG recordings demonstrate increased MCD and percent fiberpairs with increased jitter and blocking in the EDC muscle only. The Frontalis muscle is normal.    The conclusion was, these electrodiagnostic findings continue to demonstrate anabnormality of neuromuscular transmission in the EDC muscle. Quantitatively there has been slight worsening in the EDC muscle while the Frontalis muscleis not changed.     He is currently taking prednisone 30 mg daily, Mestinon 60 mg 3 times a day, he works as a inScientist, product/process developmentassistant patient, with repetitive movement, he continues to complain shortness of breath, exertion, muscle weakness, at the end of the day, usually developed double vision especially looking to the left side, denies chewing difficulty, but try to  avoid tough meat,  Laboratory evaluations in 2022, lipid panel, elevated LDL 109, cholesterol 218, PSA 1.1, normal TSH 2.0, negative HIV, hepatitis A, B, C, hepatitis B surface antigen was nonreactive, antibody was  more than 1 solvent, A1c was 6.3, normal liver functional test, creatinine 1.28, hemoglobin 15,  Per record, previous multiple CT chest that showed no thymus pathology REVIEW OF SYSTEMS: Full 14 system review of systems performed and notable only for as above All other review of systems were negative.  ALLERGIES: No Known Allergies  HOME MEDICATIONS: Current Outpatient Medications  Medication Sig Dispense Refill  . acetaminophen (TYLENOL) 325 MG tablet Take 650 mg by mouth every 6 (six) hours as needed for mild pain, fever or headache.    . metoprolol tartrate (LOPRESSOR) 25 MG tablet Take 1 tablet (25 mg total) by mouth 2 (two) times daily. 180 tablet 3  . omeprazole (PRILOSEC) 20 MG capsule Take 20 mg by mouth daily.    . Oxycodone HCl 10 MG TABS Take 10 mg by mouth 2 (two) times daily.    . predniSONE (DELTASONE) 10 MG tablet Take 40 mg p.o. daily for 3 days, then, continue usual dosing of 30 mg p.o. daily. 60 tablet 0  . pyridostigmine (MESTINON) 60 MG tablet Take 60 mg by mouth 3 (three) times daily.     No current facility-administered medications for this visit.    PAST MEDICAL HISTORY: Past Medical History:  Diagnosis Date  . Asthma    childhood issue  . Atrial fibrillation, transient ([place])   . Myasthenia gravis ([place]) .  Marland Kitchen Thyroid disease     PAST SURGICAL HISTORY: Past Surgical History:  Procedure Laterality Date  . ABLATION    . TRACHEAL SURGERY    . WISDOM TOOTH EXTRACTION      FAMILY HISTORY: Family History  Problem Relation Age of Onset  . Cancer Mother        unsure of type  . Other Father        unsure of medical history  . Healthy Sister   . Healthy Brother     SOCIAL HISTORY: Social History   Socioeconomic History  . Marital status: Married    Spouse name: Not on file  . Number of children: 0  . Years of education: GED  . Highest education level: Not on file  Occupational History  . 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 Smoking status: Former Smoker    Packs/day: 0.25    Years: 10.00    Pack years: 2.50    Types: Cigarettes    Quit date: 1995    Years since quitting: 27.1  . Smokeless tobacco: Never Used  Vaping Use  . Vaping Use: Never used  Substance and Sexual Activity  . Alcohol use: No  . Drug use: No  . Sexual activity: Yes  Other Topics Concern  . Not on file  Social History Narrative   Lives at home with his wife.   Right-handed.   Three cups caffeine per day.   Social Determinants of Health   Financial Resource Strain: Not on file  Food Insecurity: Not on file  Transportation Needs: Not on file  Physical Activity: Not on file  Stress: Not on file  Social Connections: Not on file  Intimate Partner Violence: Not on file     PHYSICAL EXAM   Vitals:   07/06/20 0943  BP: 121/82  Pulse: 71  Weight: 257 lb (116.6 kg)  Height: 6' 1"  (1.854 m)   Not recorded     Body mass index is 33.91 kg/m.  PHYSICAL EXAMNIATION:  Gen: NAD, conversant, well nourised, well groomed                     Cardiovascular: Regular rate rhythm, no peripheral edema, warm, nontender. Eyes: Conjunctivae clear without exudates or hemorrhage Neck: Supple, no carotid bruits. Pulmonary: Clear to auscultation bilaterally   NEUROLOGICAL EXAM:  MENTAL STATUS: Speech:    Speech is normal; fluent and spontaneous with normal comprehension.  Cognition:     Orientation to time, place and person     Normal recent and remote memory     Normal Attention span and concentration     Normal Language, naming, repeating,spontaneous speech     Fund of knowledge   CRANIAL NERVES: CN II: Visual fields are full to confrontation. Pupils are round equal and briskly reactive to light. CN III, IV, VI: extraocular movement are normal. No ptosis.  Cover and uncover testing/Red lens testing showed right super/exotropia, left inferior/exotropia, weakness of bilateral lateral rectus muscle CN V: Facial  sensation is intact to light touch CN VII: Mild eye closure, Kreiser puff weakness CN VIII: Hearing is normal to causal conversation. CN IX, X: Phonation is normal. CN XI: Head turning and shoulder shrug are intact  MOTOR: Mild neck flexion, shoulder abduction weakness  REFLEXES: Reflexes are 2+ and symmetric at the biceps, triceps, knees, and ankles. Plantar responses are flexor.  SENSORY: Intact to light touch, pinprick and vibratory sensation are intact in fingers and toes.  COORDINATION: There is no trunk or limb dysmetria noted.  GAIT/STANCE: He can get up from seated, and squatting down position without assistant, steady gait   DIAGNOSTIC DATA (LABS, IMAGING, TESTING) - I reviewed patient records, labs, notes, testing and imaging myself where available.   ASSESSMENT AND PLAN  Kassem Kibbe is a 50 y.o. male   Seropositive generalized myasthenia gravis  Previous significant bulbar, ocular, limb muscle weakness, required prolonged intubation, tracheostomy in 2012,  Significant improvement following plasma exchange,  CT chest that showed no sinus pathology  From patient and record, there is no evidence of IVIG, and steroid sparing agent treatment in the past, patient was worried about the side effect of skin cancer taking CellCept,  Refilled his prednisone 30 mg daily, Mestinon 60 mg 3 times a day,  Check acetylcholine receptor binding antibody  We spent lengthy time discussed about long-term side effect was consistent to moderate to high dose of prednisone use, likely he will benefit a steroid sparing agent, went over potential side effect of CellCept with him in detail, also printout related information, we will go over it again in revisit in 3 months, other options i.e. Imuran, Solaris  Total time spent reviewing the chart, obtaining history, examined patient, ordering tests, documentation, consultations and family, care coordination was 27     Marcial Pacas, M.D.  Ph.D.  [facility] [address], [place], [place] [address] Ph: [phone] Fax: [phone]  CC:  Janith Lima, MD [address] [place],  [place] [address]

## 2020-07-07 ENCOUNTER — Telehealth: Payer: Self-pay | Admitting: Neurology

## 2020-07-07 DIAGNOSIS — G7000 Myasthenia gravis without (acute) exacerbation: Secondary | ICD-10-CM

## 2020-07-07 LAB — ACETYLCHOLINE RECEPTOR, BINDING: AChR Binding Ab, Serum: 0.07 nmol/L (ref 0.00–0.24)

## 2020-07-07 NOTE — Telephone Encounter (Signed)
Left patient a detailed message, with results, on voicemail (ok per DPR).  Provided our number to call back with any questions.

## 2020-07-07 NOTE — Telephone Encounter (Signed)
REASON FOR VISIT: History from [facility] neuromuscular clinic Dr. Nadara Mustard,  Mr. Mudgett return to the Neuromuscular disorders clinic after being last seen on December 17, 2011 for continued management of his myasthenia gravis. He is seen today at the request of Dr. Collene Mares, his primary neurologist.  PRESENT ILLNESS: Mr. Doering was initially diagnosed with Myasthenia Gravis back in 2011 when he presented with acute respiratory failure. He also has a history of graves disease s/p thyroid (radioactive iodine) ablation, atrial fibrillation s/p cardiac ablation. At the time of his last visit he continued to report mild weakness an fatigue, which was attributed to abrupt discontinuation of steroids. He had some difficulty getting access to his prednisone and pyridostigmine medications when his refills were due for reasons that remain unclear. Since that time he has not had any further issues with obtaining his refills. He has since had a CT of his chest, of which the results were not available for review today. He was sent by Dr. Collene Mares to review his case and determine the necessity for thymectomy. Today Mr. Dorko reports that he continues to have some weakness intermittently during the day. 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 He finds that lifting can be tiring on his arms which sometimes requires him to take breaks. Since being consistent on his Prednisone and Mestinon, he feels that he is doing about the same overall. Some days are better than others when it comes to his weakness. He no longer reports difficulty with turning in bed or getting out of bed. He continues to have blurred vision daily that impairs his ability to read with intermittent double vision. He attempted to reduce his mestion to see if his vision improved but he did not find any change with his reduction. He has also noticed numbness in his forearm through his hands when lying down that improves with position  change. He had similar symptoms in his legs that also improve with position change. He has been on prednisone at 31m once daily for the past two years and reports only recently starting Calcium and Vitamin D a few months ago when he saw Dr. MCollene Mares   Please call patient, acetylcholine receptor binding antibody  was negative, may consider repeat more extensive laboratory evaluation at next office follow-up

## 2020-07-08 ENCOUNTER — Ambulatory Visit (INDEPENDENT_AMBULATORY_CARE_PROVIDER_SITE_OTHER): Payer: 59

## 2020-07-08 ENCOUNTER — Telehealth: Payer: Self-pay

## 2020-07-08 ENCOUNTER — Other Ambulatory Visit: Payer: Self-pay

## 2020-07-08 DIAGNOSIS — Z23 Encounter for immunization: Secondary | ICD-10-CM

## 2020-07-08 NOTE — Telephone Encounter (Signed)
Pt arrived for a hep A vaccine. He stated he was also suppose to get a tb test and spoke to someone on the phone about it a few days ago. I did not see a telephone encounter with an order/discussion or see any notations from previous visit note stating that one was needed. I informed the pt I would have to follow up with PCP and give him a phone call to schedule another nurse visit. Please advise.

## 2020-07-10 ENCOUNTER — Other Ambulatory Visit: Payer: Self-pay | Admitting: Internal Medicine

## 2020-07-10 DIAGNOSIS — Z111 Encounter for screening for respiratory tuberculosis: Secondary | ICD-10-CM | POA: Insufficient documentation

## 2020-07-10 NOTE — Telephone Encounter (Signed)
Lab ordered to screen for TB

## 2020-07-11 NOTE — Telephone Encounter (Signed)
Called pt, LVM.   

## 2020-07-12 ENCOUNTER — Encounter: Payer: Self-pay | Admitting: Physical Medicine & Rehabilitation

## 2020-07-13 ENCOUNTER — Other Ambulatory Visit: Payer: Self-pay

## 2020-07-13 ENCOUNTER — Ambulatory Visit (INDEPENDENT_AMBULATORY_CARE_PROVIDER_SITE_OTHER): Payer: 59

## 2020-07-13 DIAGNOSIS — Z111 Encounter for screening for respiratory tuberculosis: Secondary | ICD-10-CM

## 2020-07-14 ENCOUNTER — Ambulatory Visit: Payer: 59

## 2020-07-14 DIAGNOSIS — R0609 Other forms of dyspnea: Secondary | ICD-10-CM

## 2020-07-14 DIAGNOSIS — R072 Precordial pain: Secondary | ICD-10-CM

## 2020-07-14 DIAGNOSIS — R0600 Dyspnea, unspecified: Secondary | ICD-10-CM

## 2020-07-14 DIAGNOSIS — R9431 Abnormal electrocardiogram [ECG] [EKG]: Secondary | ICD-10-CM

## 2020-07-15 ENCOUNTER — Ambulatory Visit (INDEPENDENT_AMBULATORY_CARE_PROVIDER_SITE_OTHER): Payer: 59

## 2020-07-15 ENCOUNTER — Other Ambulatory Visit: Payer: Self-pay

## 2020-07-15 ENCOUNTER — Ambulatory Visit (INDEPENDENT_AMBULATORY_CARE_PROVIDER_SITE_OTHER): Payer: 59 | Admitting: Orthopaedic Surgery

## 2020-07-15 ENCOUNTER — Ambulatory Visit: Payer: 59

## 2020-07-15 VITALS — BP 132/79 | HR 70 | Ht 73.0 in | Wt 257.0 lb

## 2020-07-15 DIAGNOSIS — M25562 Pain in left knee: Secondary | ICD-10-CM

## 2020-07-15 DIAGNOSIS — G8929 Other chronic pain: Secondary | ICD-10-CM

## 2020-07-15 DIAGNOSIS — M2242 Chondromalacia patellae, left knee: Secondary | ICD-10-CM | POA: Diagnosis not present

## 2020-07-15 DIAGNOSIS — M25511 Pain in right shoulder: Secondary | ICD-10-CM | POA: Diagnosis not present

## 2020-07-15 DIAGNOSIS — Z021 Encounter for pre-employment examination: Secondary | ICD-10-CM

## 2020-07-15 LAB — TB SKIN TEST
Induration: 0 mm
TB Skin Test: NEGATIVE

## 2020-07-15 NOTE — Progress Notes (Signed)
Office Visit Note   Patient: Russell Green           Date of Birth: April 09, 1971           MRN: [id number] Visit Date: 07/15/2020              Requested by: Janith Lima, MD [address] [place],  [place] [address] PCP: Janith Lima, MD   Assessment & Plan: Visit Diagnoses:  1. Chronic pain of left knee   2. Chronic right shoulder pain   3. Chondromalacia patellae, left knee     Plan: Left knee injection performed which she tolerated well.  Improvement in walking postinjection.  He can returnIf he has increased problems with the shoulder.  Follow-Up Instructions: No follow-ups on file.   Orders:  Orders Placed This Encounter  Procedures  . Large Joint Inj: L knee  . XR Knee 1-2 Views Left  . XR Shoulder Right   No orders of the defined types were placed in this encounter.     Procedures: Large Joint Inj: L knee on 07/15/2020 2:59 PM Indications: joint swelling and pain Details: 22 G 1.5 in needle, anterolateral approach  Arthrogram: No  Medications: 0.5 mL lidocaine 1 %; 3 mL bupivacaine 0.5 %; 40 mg methylPREDNISolone acetate 40 MG/ML Outcome: tolerated well, no immediate complications Procedure, treatment alternatives, risks and benefits explained, specific risks discussed. Consent was given by the patient. Immediately prior to procedure a time out was called to verify the correct patient, procedure, equipment, support staff and site/side marked as required. Patient was prepped and draped in the usual sterile fashion.       Clinical Data: No additional findings.   Subjective: Chief Complaint  Patient presents with  . Left Knee - Pain  . Right Shoulder - Pain    HPI 50 year old male seen with left knee pain off and on for years.  Patient's had problems with swelling aching.  Is also had some right shoulder pain x2 months with decreased range of motion.  He still able get his arm up over his head.  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  One of the 2 has to help with lifting.  He has no swelling in his knee pain with squatting.  He has noted noise when he tries to go up and down steps.  Review of Systems past history of LFT elevation.  Current left knee pain hyperglycemia.  Diagnosis of myasthenia gravis.   Objective: Vital Signs: BP 132/79   Pulse 70   Ht 6' 1"  (1.854 m)   Wt 257 lb (116.6 kg)   BMI 33.91 kg/m   Physical Exam Constitutional:      Appearance: He is well-developed.  HENT:     Head: Normocephalic and atraumatic.  Eyes:     Pupils: Pupils are equal, round, and reactive to light.  Neck:     Thyroid: No thyromegaly.     Trachea: No tracheal deviation.  Cardiovascular:     Rate and Rhythm: Normal rate.  Pulmonary:     Effort: Pulmonary effort is normal.     Breath sounds: No wheezing.  Abdominal:     General: Bowel sounds are normal.     Palpations: Abdomen is soft.  Skin:    General: Skin is warm and dry.     Capillary Refill: Capillary refill takes less than 2 seconds.  Neurological:     Mental Status: He  is alert and oriented to person, place, and time.  Psychiatric:        Behavior: Behavior normal.        Thought Content: Thought content normal.        Judgment: Judgment normal.     Ortho Exam Patient has negative logroll of the hips he can get his arm up over his head some discomfort with impingement.  No brachial plexus tenderness.  Left knee has significant crepitus with knee extension which is audible and palpable.  Minimal on the right knee.  Collateral ligaments are stable 2+ knee effusion.  Significant pain with attempted crouching.  No patellar subluxation.  Specialty Comments:  No specialty comments available.  Imaging: No results found.   PMFS History: Patient Active Problem List   Diagnosis Date Noted  . Chondromalacia patellae, left knee 07/15/2020  . Screening-pulmonary TB 07/10/2020  . Myasthenia gravis ([place]) 07/06/2020  . Elevated LFTs  06/29/2020  . Hyperglycemia 06/29/2020  . Encounter for general adult medical examination with abnormal findings 06/29/2020  . DOE (dyspnea on exertion) 06/29/2020  . Chronic midline low back pain with bilateral sciatica 06/29/2020  . Primary osteoarthritis of left knee 06/29/2020  . Abnormal electrocardiogram (ECG) (EKG) 06/29/2020  . MG (myasthenia gravis) ([place]) 02/09/2019   Past Medical History:  Diagnosis Date  . Asthma    childhood issue  . Atrial fibrillation, transient ([place])   . Myasthenia gravis ([place]) .  Marland Kitchen Thyroid disease     Family History  Problem Relation Age of Onset  . Cancer Mother        unsure of type  . Other Father        unsure of medical history  . Healthy Sister   . Healthy Brother     Past Surgical History:  Procedure Laterality Date  . ABLATION    . TRACHEAL SURGERY    . WISDOM TOOTH EXTRACTION     Social History   Occupational History  . 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 Smoking status: Former Smoker    Packs/day: 0.25    Years: 10.00    Pack years: 2.50    Types: Cigarettes    Quit date: 1995    Years since quitting: 27.2  . Smokeless tobacco: Never Used  Vaping Use  . Vaping Use: Never used  Substance and Sexual Activity  . Alcohol use: No  . Drug use: No  . Sexual activity: Yes

## 2020-07-15 NOTE — Progress Notes (Signed)
Pt came in office today for TB reading. TB skin test negative and results placed in chart under immunizations.

## 2020-07-18 ENCOUNTER — Telehealth (HOSPITAL_COMMUNITY): Payer: Self-pay | Admitting: *Deleted

## 2020-07-18 MED ORDER — LIDOCAINE HCL 1 % IJ SOLN
0.5000 mL | INTRAMUSCULAR | Status: AC | PRN
  Administered 2020-07-15: .5 mL

## 2020-07-18 MED ORDER — METHYLPREDNISOLONE ACETATE 40 MG/ML IJ SUSP
40.0000 mg | INTRAMUSCULAR | Status: AC | PRN
  Administered 2020-07-15: 40 mg via INTRA_ARTICULAR

## 2020-07-18 MED ORDER — BUPIVACAINE HCL 0.5 % IJ SOLN
3.0000 mL | INTRAMUSCULAR | Status: AC | PRN
  Administered 2020-07-15: 3 mL via INTRA_ARTICULAR

## 2020-07-18 NOTE — Telephone Encounter (Signed)
Reaching out to patient to offer assistance regarding upcoming cardiac imaging study; pt verbalizes understanding of appt date/time, parking situation and where to check in, pre-test NPO status and medications ordered, and verified current allergies; name and call back number provided for further questions should they arise  Gordy Clement RN Navigator Cardiac [facility] [phone] office [phone] cell

## 2020-07-19 NOTE — Addendum Note (Signed)
Addended by: Hinda Kehr on: 07/19/2020 07:37 AM   Modules accepted: Orders

## 2020-07-20 ENCOUNTER — Other Ambulatory Visit: Payer: Self-pay

## 2020-07-20 ENCOUNTER — Ambulatory Visit (HOSPITAL_COMMUNITY)
Admission: RE | Admit: 2020-07-20 | Discharge: 2020-07-20 | Disposition: A | Discharge status: Home / Self Care | Payer: 59 | Source: Ambulatory Visit | Attending: Internal Medicine | Admitting: Internal Medicine

## 2020-07-20 DIAGNOSIS — R072 Precordial pain: Secondary | ICD-10-CM | POA: Insufficient documentation

## 2020-07-20 LAB — COLOGUARD: Cologuard: NEGATIVE

## 2020-07-20 MED ORDER — NITROGLYCERIN 0.4 MG SL SUBL
0.8000 mg | SUBLINGUAL_TABLET | Freq: Once | SUBLINGUAL | Status: AC
  Administered 2020-07-20: 0.8 mg via SUBLINGUAL

## 2020-07-20 MED ORDER — IOHEXOL 350 MG/ML SOLN
80.0000 mL | Freq: Once | INTRAVENOUS | Status: AC | PRN
  Administered 2020-07-20: 80 mL via INTRAVENOUS

## 2020-07-20 MED ORDER — NITROGLYCERIN 0.4 MG SL SUBL
SUBLINGUAL_TABLET | SUBLINGUAL | Status: AC
  Filled 2020-07-20: qty 2

## 2020-07-23 DIAGNOSIS — R072 Precordial pain: Secondary | ICD-10-CM | POA: Insufficient documentation

## 2020-07-28 NOTE — Progress Notes (Signed)
Patient is aware to schedule an appointment, but will call back after this weekend because he is going out of town, and also he will call [facility], and see is he needs a release form signed or if he can give a verbal.

## 2020-08-09 ENCOUNTER — Other Ambulatory Visit: Payer: Self-pay

## 2020-08-09 ENCOUNTER — Encounter: Payer: 59 | Attending: Physical Medicine & Rehabilitation | Admitting: Physical Medicine & Rehabilitation

## 2020-08-09 ENCOUNTER — Encounter: Payer: Self-pay | Admitting: Physical Medicine & Rehabilitation

## 2020-08-09 VITALS — BP 112/66 | HR 66 | Temp 98.5°F | Ht 73.0 in | Wt 259.2 lb

## 2020-08-09 DIAGNOSIS — M545 Low back pain, unspecified: Secondary | ICD-10-CM | POA: Insufficient documentation

## 2020-08-09 DIAGNOSIS — G8929 Other chronic pain: Secondary | ICD-10-CM | POA: Diagnosis present

## 2020-08-09 NOTE — Progress Notes (Signed)
Subjective:    Patient ID: Russell Green, male    DOB: 22-Feb-1971, 50 y.o.   MRN: [id number]  HPI   CC: Chronic low back pain and left knee pain 50 year old male with history of insidious onset low back pain in 2012.  He has been followed by primary care and was being followed at [facility] pain medicine.  He has tried physical therapy, currently doing some back stretching, has not had any spine surgery.  The patient states that at [facility] pain center there were discussions regarding back injections.  I looked at his last clinic note from February 2022 which indicates lumbar medial branch blocks were discussed.  In addition the patient was maintained on oxycodone 10 mg twice daily.  He apparently had some type of problem with acetaminophen and this prompted a switch from Percocet to oxycodone.  He states that it may have caused some itching. In addition the patient has a prior medical history significant for myasthenia gravis and is currently seeing Dr. Krista Blue from [facility] neurology for this diagnosis.  He remains on pyridostigmine as well as prednisone for this. The patient's lumbar pain levels about 9 out of 10 sharp and stabbing constant aching worse with walking bending standing improved with rest and medications there is no diurnal variation with his pain.  He sometimes uses a cane he has difficulty climbing steps but this is mainly related to his myasthenia gravis.  He does have some limitations in certain household activities.  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  Activity level spends 10 to 12 hours a day in bed, sits 3 to 4 hours a day stands 2 to 3 hours a day and walks 1 to 2 hours a day.  He exercises 2 days a week with walking moderate exercise about 5 to 10 minutes at a time.  He used to do weight lifting. Other review of systems significant for night sweats shortness of breath as well as constipation.  ~35mohx of left knee pain, had fluid and was hard to bend  , crepitus, [place] knee injection per Dr YDarreld Mclean No injections for back, tried PT,  Pain Inventory Average Pain 9 Pain Right Now 9 My pain is constant, sharp, stabbing and aching  In the last 24 hours, has pain interfered with the following? General activity 8 Relation with others 9 Enjoyment of life 9 What TIME of day is your pain at its worst? morning , daytime, evening, night and varies Sleep (in general) Fair  Pain is worse with: walking, bending, standing and some activites Pain improves with: rest and medication Relief from Meds: 6  walk without assistance use a cane how many minutes can you walk? 10-15 MINS ability to climb steps?  yes do you drive?  yes Do you have any goals in this area?  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  yes  weakness numbness tremor trouble walking anxiety  Any changes since last visit?  [facility]   Any changes since last visit?  no New Patient    Family History  Problem Relation Age of Onset  . Cancer Mother        unsure of type  . Other Father  unsure of medical history  . Healthy Sister   . Healthy Brother    Social History   Socioeconomic History  . Marital status: Married    Spouse name: Not on file  . Number of children: 0  . Years of education: GED  . Highest education level: Not on file  Occupational History  . 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 Smoking status: Former Smoker    Packs/day: 0.25    Years: 10.00    Pack years: 2.50    Types: Cigarettes    Quit date: 1995    Years since quitting: 27.2  . Smokeless tobacco: Never Used  Vaping Use  . Vaping Use: Never used  Substance and Sexual Activity  . Alcohol use: No  . Drug use: No  . Sexual activity: Yes  Other Topics  Concern  . Not on file  Social History Narrative   Lives at home with his wife.   Right-handed.   Three cups caffeine per day.   Social Determinants of Health   Financial Resource Strain: Not on file  Food Insecurity: Not on file  Transportation Needs: Not on file  Physical Activity: Not on file  Stress: Not on file  Social Connections: Not on file   Past Surgical History:  Procedure Laterality Date  . ABLATION    . TRACHEAL SURGERY    . WISDOM TOOTH EXTRACTION     Past Medical History:  Diagnosis Date  . Asthma    childhood issue  . Atrial fibrillation, transient ([place])   . Myasthenia gravis ([place]) .  Marland Kitchen Thyroid disease    BP 112/66   Pulse 66   Temp 98.5 F (36.9 C)   Ht 6' 1"  (1.854 m)   Wt 259 lb 3.2 oz (117.6 kg)   SpO2 96%   BMI 34.20 kg/m   Opioid Risk Score:   Fall Risk Score:  `1  Depression screen PHQ 2/9  Depression screen [facility] 2/9 08/09/2020 06/29/2020  Decreased Interest 0 0  Down, Depressed, Hopeless 0 0  PHQ - 2 Score 0 0  Altered sleeping 0 -  Tired, decreased energy 0 -  Change in appetite 0 -  Feeling bad or failure about yourself  0 -  Trouble concentrating 0 -  Moving slowly or fidgety/restless 0 -  Suicidal thoughts 0 -  PHQ-9 Score 0 -   Review of Systems  Musculoskeletal: Positive for back pain and gait problem.       Left knee pain  All other systems reviewed and are negative.      Objective:   Physical Exam Vitals and nursing note reviewed.  Constitutional:      General: He is not in acute distress.    Appearance: He is obese.  HENT:     Head: Normocephalic and atraumatic.  Eyes:     Extraocular Movements: Extraocular movements intact.     Conjunctiva/sclera: Conjunctivae normal.     Pupils: Pupils are equal, round, and reactive to light.  Cardiovascular:     Rate and Rhythm: Normal rate and regular rhythm.     Heart sounds: Normal heart sounds. No murmur heard.   Pulmonary:     Effort: Pulmonary effort is normal.      Breath sounds: Normal breath sounds.  Abdominal:     General: Abdomen is flat. Bowel sounds are normal. There is no distension.     Palpations: Abdomen is soft. There is no mass.  Musculoskeletal:        General: No tenderness.     Cervical back: Normal range of motion.     Right lower leg: No edema.     Left lower leg: No edema.     Comments: Knee showed no evidence of effusion there is no tenderness palpation patient has good range of motion of his knees. Lumbar spine has tenderness palpation bilateral L5 or L5-S1 areas  Lumbar range of motion is 50% lumbar flexion extension lateral bending and rotation.  Skin:    General: Skin is warm and dry.  Neurological:     Mental Status: He is alert and oriented to person, place, and time.     Motor: No weakness or abnormal muscle tone.     Coordination: Coordination is intact.     Gait: Gait is intact.     Comments: Motor strength is 5/5 bilateral deltoid bicep tricep grip hip flexor knee extensor ankle dorsiflexor Negative straight leg raising test bilaterally.  Psychiatric:        Mood and Affect: Mood normal.        Behavior: Behavior normal.           Assessment & Plan:  #1.  Lumbar pain likely spondylosis given that imaging studies did not show any significant disc space loss.  His pain is more with standing than with sitting which also is consistent with lumbar spondylosis affecting facet joints. We discussed exercises for low back pain including cat cow exercise We discussed medication management but would need to get UDS results back first.  Would start with tramadol 50 mg twice daily, if UDS is consistent We discussed trial medial branch blocks, he did receive a pamphlet describing RF procedure, he would like to discuss this further at next visit

## 2020-08-09 NOTE — Patient Instructions (Signed)
Discuss injections at next visit  Will need to review urine screen before prescribing meds

## 2020-08-16 LAB — TOXASSURE SELECT,+ANTIDEPR,UR

## 2020-08-17 ENCOUNTER — Telehealth: Payer: Self-pay | Admitting: *Deleted

## 2020-08-17 NOTE — Telephone Encounter (Signed)
Urine drug screen for this encounter is consistent for prescribed medication oxycodone but also has metabolites of diazepam or some other related benzo. I checked PMP for 5 yrs and there was no prescription for any benzodiazepine.

## 2020-08-22 ENCOUNTER — Other Ambulatory Visit: Payer: Self-pay
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
## 2020-09-06 ENCOUNTER — Encounter: Payer: 59 | Attending: Physical Medicine & Rehabilitation | Admitting: Physical Medicine & Rehabilitation

## 2020-09-06 DIAGNOSIS — M545 Low back pain, unspecified: Secondary | ICD-10-CM | POA: Insufficient documentation

## 2020-09-06 DIAGNOSIS — G8929 Other chronic pain: Secondary | ICD-10-CM | POA: Insufficient documentation

## 2020-10-05 IMAGING — DX DG CHEST 2V
2 series · 2 of 2 positions shown · non-contrast
Comparison: None.

CLINICAL DATA: Fever, cough

EXAM:
CHEST - 2 VIEW

[chest pa]
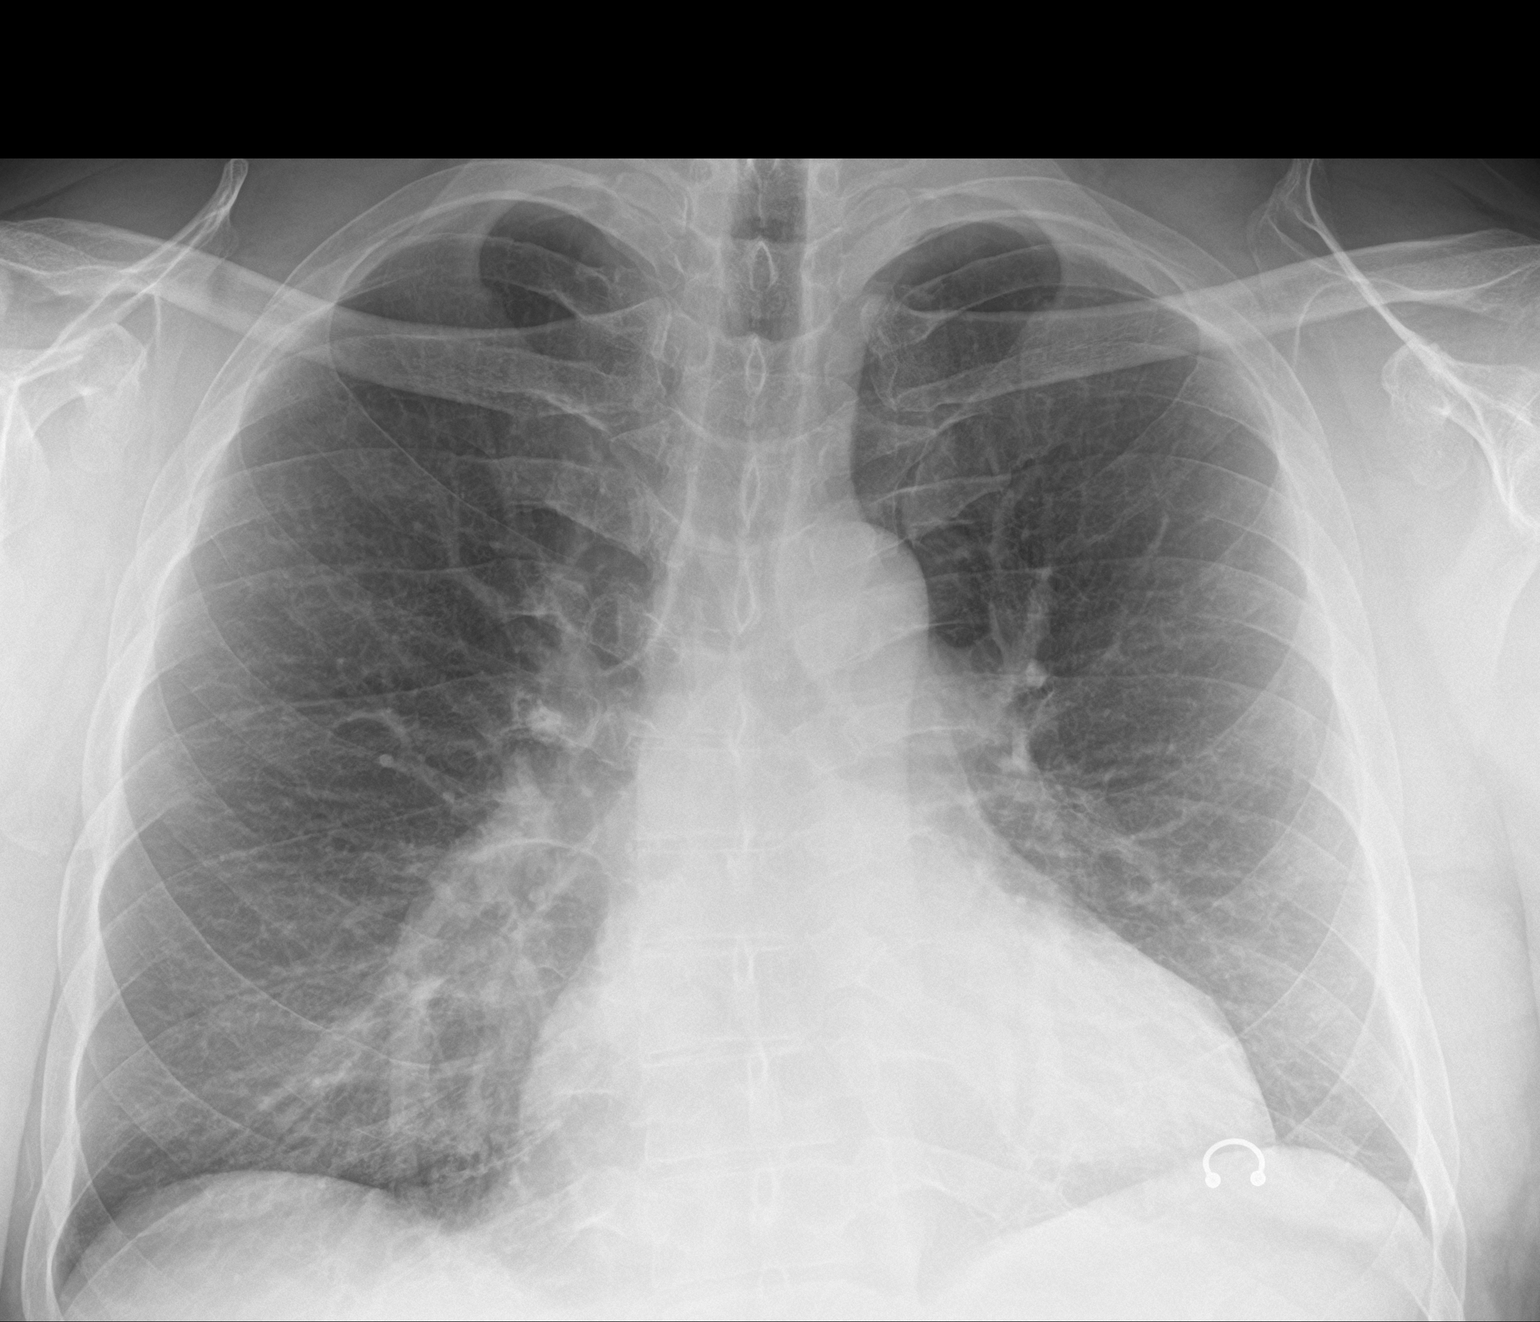

[chest lat]
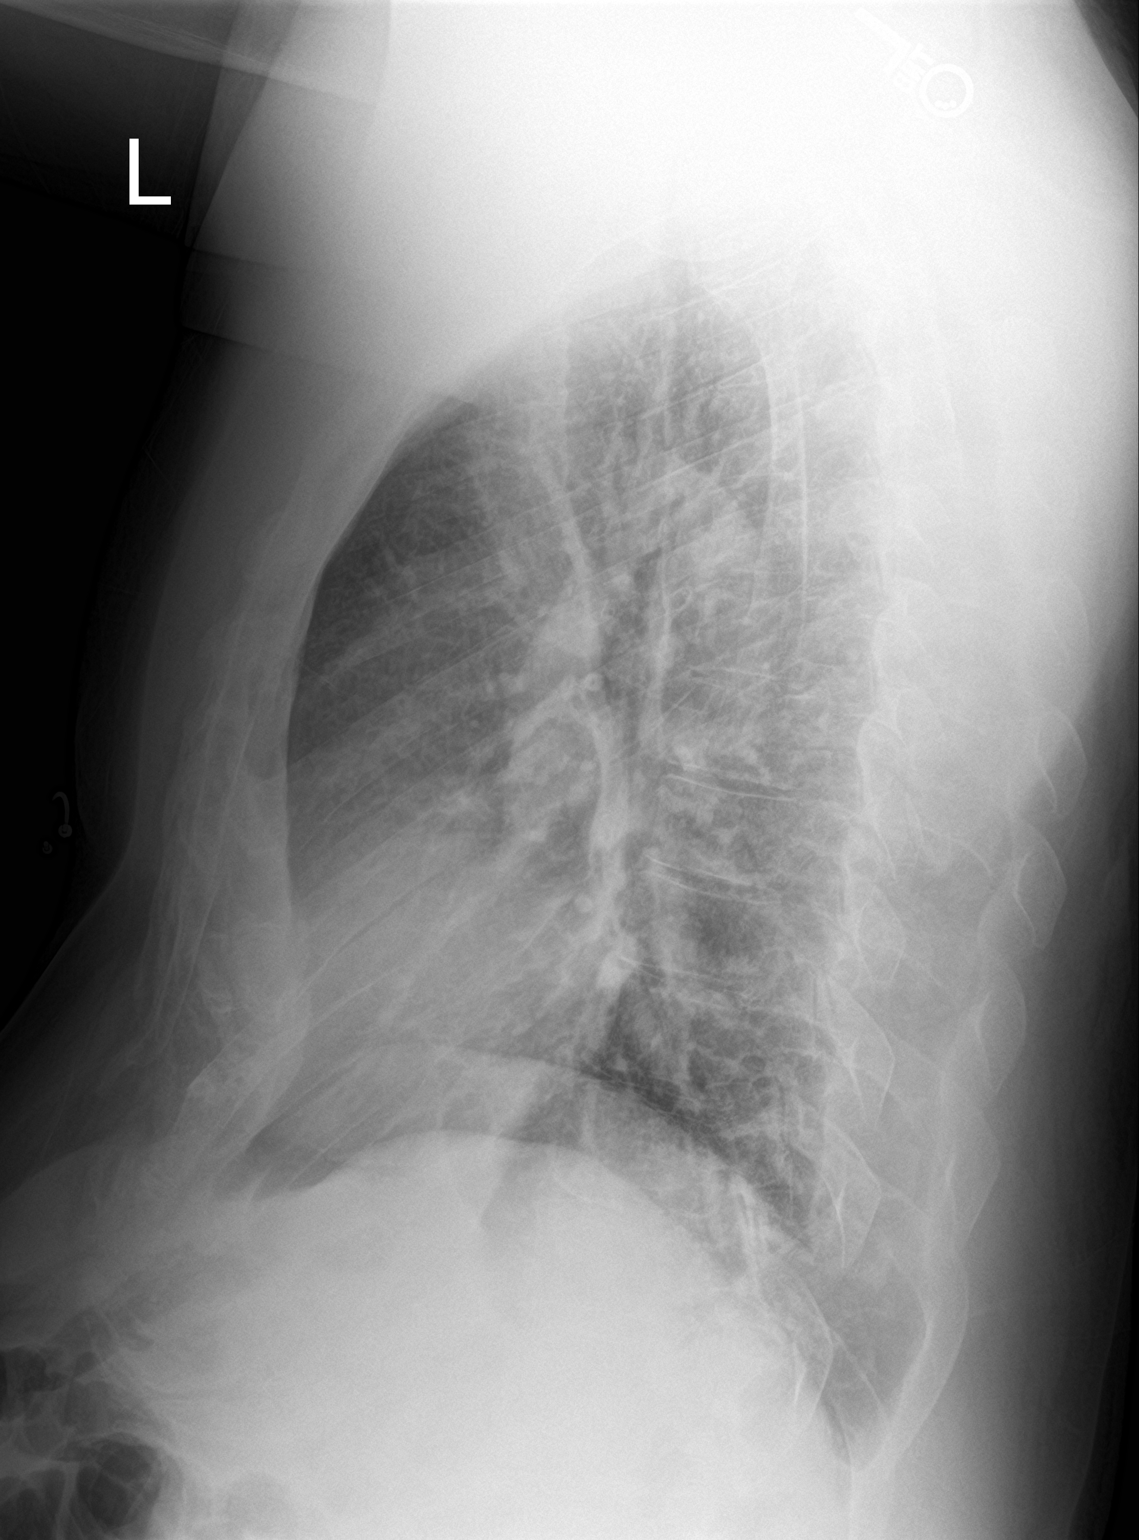

[2 of 2 positions shown; findings below may reference images not displayed]

FINDINGS: Cardiomegaly. Both lungs are clear. Pectus deformity. The visualized
skeletal structures are otherwise unremarkable.
IMPRESSION: Cardiomegaly without acute abnormality of the lungs.

## 2020-10-07 IMAGING — DX DG CHEST 1V PORT
2 series · 2 of 2 positions shown · non-contrast
Comparison: Chest x-rays dated 02/07/2019.

CLINICAL DATA: Shortness of breath. [id number] positive [REDACTED].
Febrile.

EXAM:
PORTABLE CHEST 1 VIEW

[chest ap (1 of 2)]
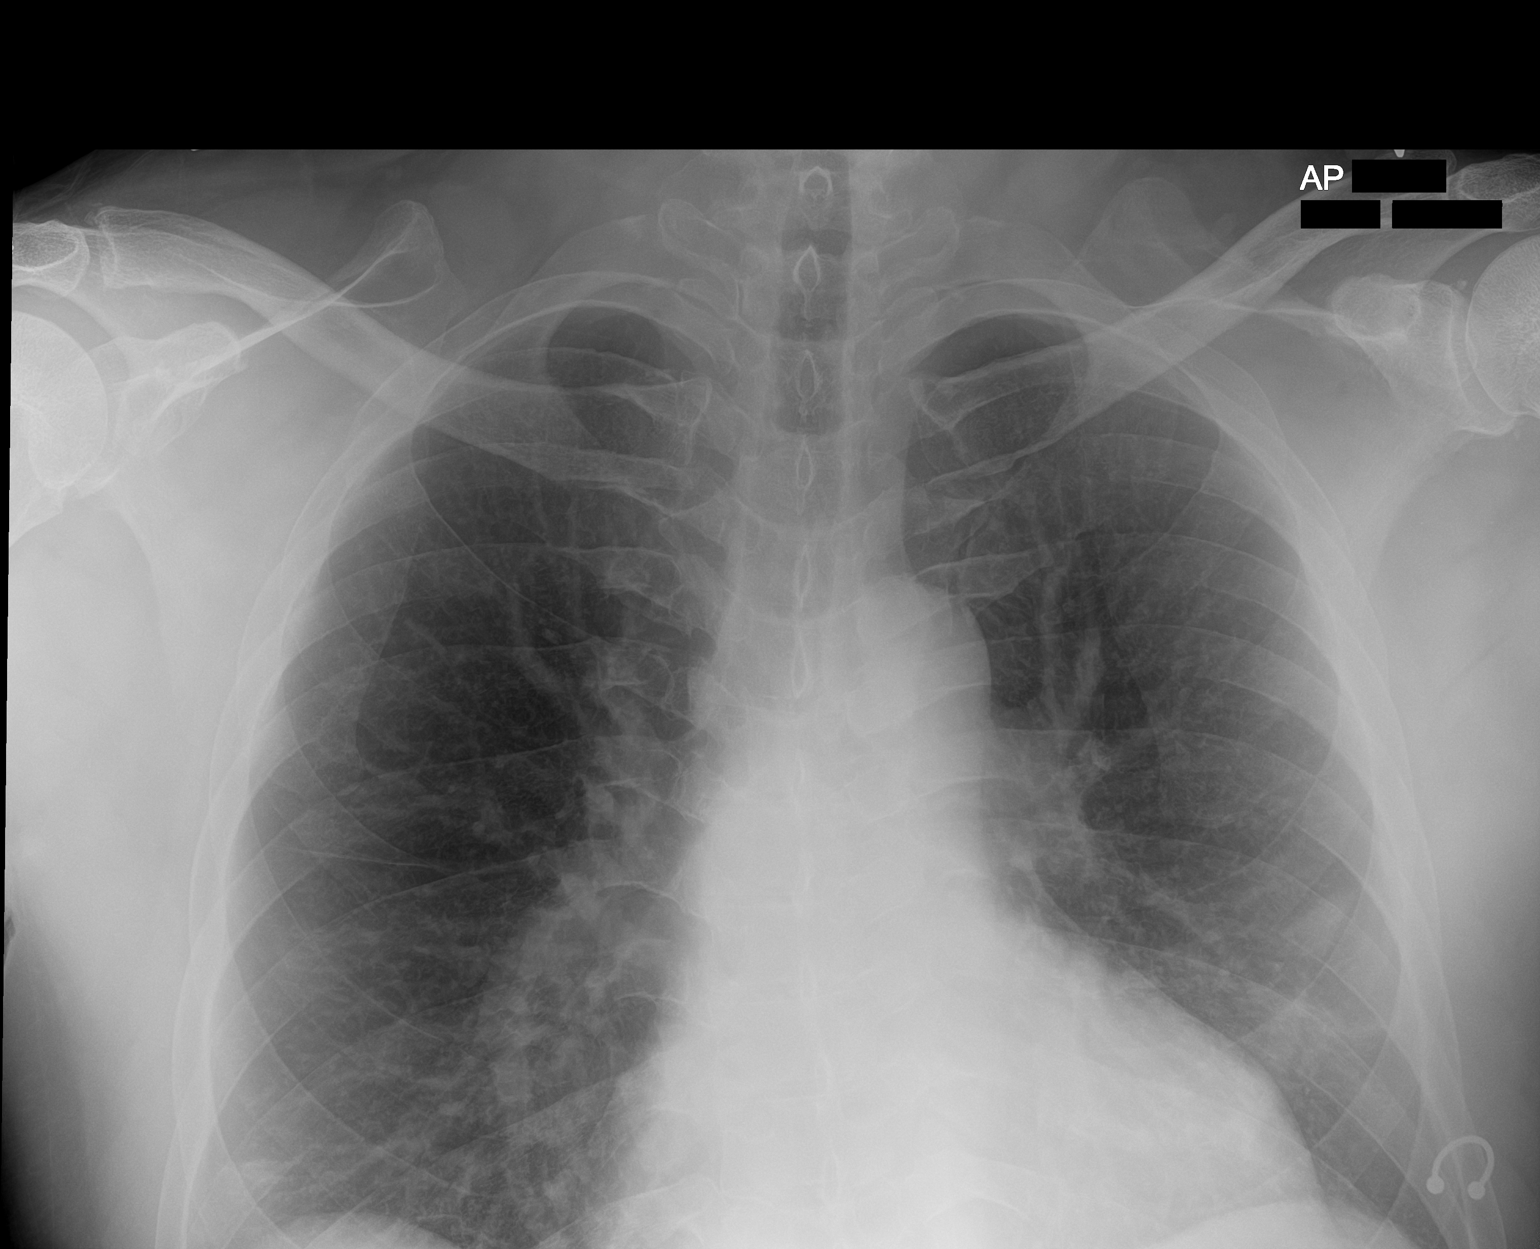

[chest ap (2 of 2)]
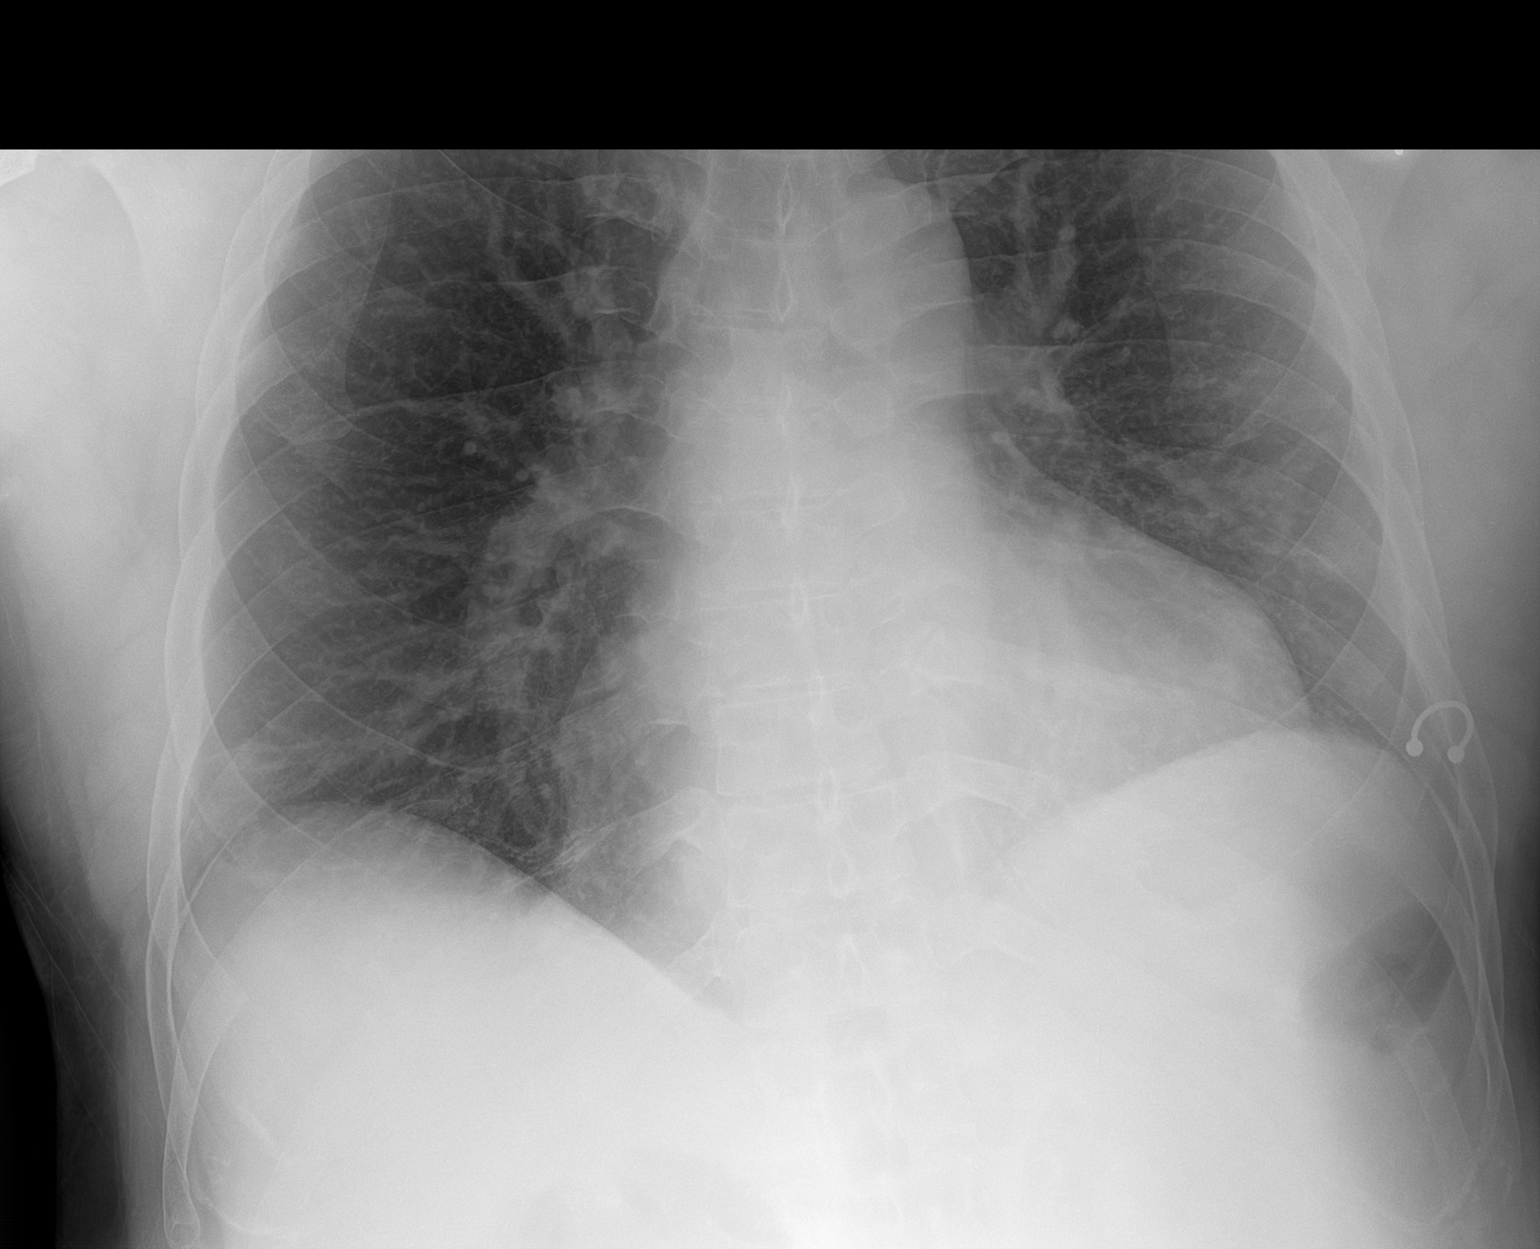

[2 of 2 positions shown; findings below may reference images not displayed]

FINDINGS: Stable cardiomegaly. Subtle opacities within the LEFT lower lung and
streaky opacities at the RIGHT lung base, suspicious for multifocal
pneumonia. No pleural effusion or pneumothorax seen. Osseous
structures about the chest are unremarkable.
IMPRESSION: 1. Subtle opacities within the LEFT lower lung and streaky opacities
at the RIGHT lung base, suspicious for multifocal pneumonia.
2. Stable cardiomegaly.

## 2020-10-19 ENCOUNTER — Ambulatory Visit: Payer: 59 | Admitting: Internal Medicine

## 2020-10-19 ENCOUNTER — Other Ambulatory Visit: Payer: Self-pay

## 2020-10-26 ENCOUNTER — Telehealth: Payer: Self-pay | Admitting: Internal Medicine

## 2020-10-26 NOTE — Telephone Encounter (Signed)
   Patient calling to report he did not no show appointment on 6/15. He states he cancelled appointment during automated call reminder system

## 2020-11-08 ENCOUNTER — Telehealth: Payer: Self-pay | Admitting: Neurology

## 2020-11-08 ENCOUNTER — Ambulatory Visit: Payer: 59 | Admitting: Neurology

## 2020-11-08 NOTE — Telephone Encounter (Signed)
Pt cancelled appt for today due to family emergency.

## 2020-11-28 ENCOUNTER — Ambulatory Visit: Payer: 59 | Admitting: Internal Medicine

## 2021-02-08 ENCOUNTER — Other Ambulatory Visit: Payer: Self-pay

## 2021-02-09 ENCOUNTER — Encounter: Payer: Self-pay | Admitting: Internal Medicine

## 2021-02-09 ENCOUNTER — Ambulatory Visit (INDEPENDENT_AMBULATORY_CARE_PROVIDER_SITE_OTHER): Payer: 59 | Admitting: Internal Medicine

## 2021-02-09 VITALS — BP 122/78 | HR 82 | Temp 98.3°F | Ht 73.0 in | Wt 260.0 lb

## 2021-02-09 DIAGNOSIS — M5441 Lumbago with sciatica, right side: Secondary | ICD-10-CM

## 2021-02-09 DIAGNOSIS — R9431 Abnormal electrocardiogram [ECG] [EKG]: Secondary | ICD-10-CM

## 2021-02-09 DIAGNOSIS — M5442 Lumbago with sciatica, left side: Secondary | ICD-10-CM

## 2021-02-09 DIAGNOSIS — R7303 Prediabetes: Secondary | ICD-10-CM | POA: Insufficient documentation

## 2021-02-09 DIAGNOSIS — R0609 Other forms of dyspnea: Secondary | ICD-10-CM

## 2021-02-09 DIAGNOSIS — G7000 Myasthenia gravis without (acute) exacerbation: Secondary | ICD-10-CM | POA: Diagnosis not present

## 2021-02-09 DIAGNOSIS — G8929 Other chronic pain: Secondary | ICD-10-CM

## 2021-02-09 NOTE — Progress Notes (Addendum)
Subjective:  Patient ID: Russell Green, male    DOB: 1970-11-05  Age: 50 y.o. MRN: [id number]  CC: Osteoarthritis and Back Pain  This visit occurred during the SARS-CoV-2 public health emergency.  Safety protocols were in place, including screening questions prior to the visit, additional usage of staff PPE, and extensive cleaning of exam room while observing appropriate contact time as indicated for disinfecting solutions.    HPI Khiem Gargis presents for f/up -  He continues to c/o NR LBP and wants to see pain management. He exercises and does not experience CP/ DOE/diaphoresis. He refused a flu vaccine.  Outpatient Medications Prior to Visit  Medication Sig Dispense Refill   acetaminophen (TYLENOL) 325 MG tablet Take 650 mg by mouth every 6 (six) hours as needed for mild pain, fever or headache.     omeprazole (PRILOSEC) 20 MG capsule Take 20 mg by mouth daily.     Oxycodone HCl 10 MG TABS Take 10 mg by mouth 2 (two) times daily.     predniSONE (DELTASONE) 10 MG tablet Take 3 tablets (30 mg total) by mouth daily with breakfast. Take 40 mg p.o. daily for 3 days, then, continue usual dosing of 30 mg p.o. daily. 90 tablet 5   pyridostigmine (MESTINON) 60 MG tablet Take 1 tablet (60 mg total) by mouth 3 (three) times daily. 90 tablet 5   metoprolol tartrate (LOPRESSOR) 25 MG tablet Take 1 tablet (25 mg total) by mouth 2 (two) times daily. 180 tablet 3   No facility-administered medications prior to visit.    ROS Review of Systems  Constitutional:  Negative for diaphoresis and fatigue.  HENT: Negative.    Eyes: Negative.   Respiratory:  Negative for cough, chest tightness, shortness of breath and wheezing.   Cardiovascular:  Negative for chest pain, palpitations and leg swelling.  Gastrointestinal:  Negative for abdominal pain.  Endocrine: Negative.   Genitourinary: Negative.   Musculoskeletal:  Positive for back pain. Negative for myalgias.  Skin: Negative.   Neurological: Negative.   Negative for dizziness, weakness, light-headedness and headaches.  Hematological:  Negative for adenopathy. Does not bruise/bleed easily.  Psychiatric/Behavioral: Negative.     Objective:  BP 122/78 (BP Location: Left Arm, Patient Position: Sitting, Cuff Size: Large)   Pulse 82   Temp 98.3 F (36.8 C) (Oral)   Ht 6' 1"  (1.854 m)   Wt 260 lb (117.9 kg)   SpO2 98%   BMI 34.30 kg/m   BP Readings from Last 3 Encounters:  02/09/21 122/78  08/09/20 112/66  07/20/20 (P) 103/64    Wt Readings from Last 3 Encounters:  02/09/21 260 lb (117.9 kg)  08/09/20 259 lb 3.2 oz (117.6 kg)  07/15/20 257 lb (116.6 kg)    Physical Exam Vitals reviewed.  HENT:     Nose: Nose normal.     Mouth/Throat:     Mouth: Mucous membranes are moist.  Eyes:     Conjunctiva/sclera: Conjunctivae normal.  Cardiovascular:     Rate and Rhythm: Normal rate and regular rhythm.     Heart sounds: No murmur heard.   No friction rub.  Pulmonary:     Effort: Pulmonary effort is normal.     Breath sounds: No stridor. No wheezing, rhonchi or rales.  Abdominal:     General: Abdomen is flat.     Palpations: There is no mass.     Tenderness: There is no abdominal tenderness. There is no guarding.     Hernia: No hernia  is present.  Musculoskeletal:        General: Normal range of motion.     Cervical back: Neck supple.     Right lower leg: No edema.     Left lower leg: No edema.  Lymphadenopathy:     Cervical: No cervical adenopathy.  Skin:    General: Skin is warm and dry.  Neurological:     General: No focal deficit present.     Mental Status: He is alert.    Lab Results  Component Value Date   WBC 6.5 06/29/2020   HGB 15.0 06/29/2020   HCT 45.0 06/29/2020   PLT 208.0 06/29/2020   GLUCOSE 74 06/29/2020   CHOL 218 (H) 06/29/2020   TRIG 127.0 06/29/2020   HDL 83.90 06/29/2020   LDLCALC 109 (H) 06/29/2020   ALT 24 06/29/2020   AST 23 06/29/2020   NA 140 06/29/2020   K 4.1 06/29/2020   CL 102  06/29/2020   CREATININE 1.28 06/29/2020   BUN 19 06/29/2020   CO2 34 (H) 06/29/2020   TSH 2.04 06/29/2020   PSA 1.12 06/29/2020   INR 1.1 (H) 06/29/2020   HGBA1C 6.3 06/29/2020    CT CORONARY MORPH W/CTA COR W/SCORE W/CA W/CM &/OR WO/CM  Addendum Date: 07/23/2020   ADDENDUM REPORT: 07/23/2020 19:00 HISTORY: Chest pain/anginal equiv, ECGs and troponins normal Dyspnea on exertion (DOE) EXAM: Cardiac/Coronary  CT TECHNIQUE: The patient was scanned on a [employer]. PROTOCOL: A 120 kV prospective scan was triggered in the descending thoracic aorta at 111 HU's. Axial non-contrast 3 mm slices were carried out through the heart. The data set was analyzed on a dedicated work station and scored using the [employer]. Gantry rotation speed was 250 msecs and collimation was .6 mm. No IV beta blockade but 0.8 mg of sl NTG was given. The 3D data set was reconstructed in 5% intervals of the 67-82 % of the R-R cycle. Diastolic phases were analyzed on a dedicated work station using MPR, MIP and VRT modes. The patient received 17m OMNIPAQUE IOHEXOL 350 MG/ML SOLN of contrast. FINDINGS: Image quality: Average. Noise artifact is: Limited. Coronary artery calcification score: Total coronary calcium score of 0. Coronary arteries: Normal coronary origins.  Right dominance. Left Main Coronary Artery:The left main is normal caliber vessel arises from the left coronary cusp and bifurcates into left anterior descending artery and left circumflex artery. There is no plaque or stenosis. Left Anterior Descending Coronary Artery: Normal caliber vessel which reaches the apex and gives rise to 1 diagonal branch. The LAD is overall patent without evidence of plaque or stenosis. First diagonal branch has superior and inferior branches the vessel is overall patent without evidence of plaque or stenosis. Left Circumflex Artery: The left circumflex artery is normal in caliber and travels within the AV groove and gives rise to  1 obtuse marginal branch. Mild stenosis (25-49%) in the mid to distal LCX due to noncalcified plaque. First obtuse marginal branch is patent without evidence of plaque or stenosis. Right Coronary Artery: The RCA is dominant with normal take off from the right coronary cusp. The RCA terminates as a PDA and right posterolateral branch without evidence of plaque or stenosis. Left Atrium: Grossly normal in size with no left atrial appendage filling defect. Left Ventricle: Grossly normal in size. There are no stigmata of prior infarction. There is no abnormal filling defect. Pulmonary arteries: Normal in size without proximal filling defect. Pulmonary veins: Normal pulmonary venous drainage. Aorta: Normal  size, 33 mm at the mid ascending aorta (level of the PA bifurcation) measured double oblique. No calcifications. No dissection. Pericardium: Normal thickness with no significant effusion or calcium present. Cardiac valves: The aortic valve is trileaflet without calcification. The mitral valve is normal structure without calcification. Extra-cardiac findings: See attached radiology report for non-cardiac structures. IMPRESSION: 1. Total coronary calcium score of 0. 2. Normal coronary origin with right dominance. 3. CAD-RADS = 2. Mild stenosis (25-49%) in the mid to distal LCX due to noncalcified plaque. RECOMMENDATIONS: Consider non-atherosclerotic causes of chest pain. Consider preventive therapy and risk factor modification. Electronically Signed   By: Rex Kras   On: 07/23/2020 19:00   Result Date: 07/23/2020 EXAM: OVER-READ INTERPRETATION  CT CHEST The following report is an over-read performed by radiologist Dr. Vinnie Langton of [facility], [place] on 07/20/2020. This over-read does not include interpretation of cardiac or coronary anatomy or pathology. The coronary calcium score/coronary CTA interpretation by the cardiologist is attached. COMPARISON:  None. FINDINGS: Within the visualized portions of the  thorax there are no suspicious appearing pulmonary nodules or masses, there is no acute consolidative airspace disease, no pleural effusions, no pneumothorax and no lymphadenopathy. Visualized portions of the upper abdomen are unremarkable. There are no aggressive appearing lytic or blastic lesions noted in the visualized portions of the skeleton. IMPRESSION: 1. No significant incidental noncardiac findings are noted. Electronically Signed: By: Vinnie Langton M.D. On: 07/20/2020 15:12    Assessment & Plan:   Klark was seen today for osteoarthritis and back pain.  Diagnoses and all orders for this visit:  DOE (dyspnea on exertion) -     Cancel: CT CARDIAC SCORING (SELF PAY ONLY); Future  Abnormal electrocardiogram (ECG) (EKG) -     Cancel: CT CARDIAC SCORING (SELF PAY ONLY); Future  Prediabetes- I will monitor his A1C. -     Basic metabolic panel; Future -     Hemoglobin A1c; Future  Chronic midline low back pain with bilateral sciatica- This was previously prescribed by anesthesia in [facility]. Will refill until he can see pain mngt. -     Ambulatory referral to Pain Clinic  MG (myasthenia gravis) ([place]) -     Ambulatory referral to Neurology  I am having Jaimere Dommer maintain his omeprazole, Oxycodone HCl, acetaminophen, metoprolol tartrate, predniSONE, and pyridostigmine.  No orders of the defined types were placed in this encounter.    Follow-up: Return in about 3 months (around 05/12/2021).  Scarlette Calico, MD

## 2021-02-09 NOTE — Patient Instructions (Signed)
Spondylolysis Spondylolysis is a small break or crack (stress fracture) in a bone in the spine (vertebra) in the lower back (lumbar spine). The stress fracture occurs on the bony mass between and behind the vertebra. Spondylolysis may be caused by an injury (trauma) or by overuse. Since the lower back is almost always under pressure from daily living, this stress fracture usually does not heal normally. Spondylolysis may eventually cause one vertebra to slip forward and out of place (spondylolisthesis). What are the causes? This condition may be caused by: Trauma, such as a fall. Excessive wear and tear. This is often a result of doing sports or physical activities that involve repetitive overstretching (hyperextension) and rotation of the spine. What increases the risk? You are more likely to develop this condition if you have: A family history of this condition. An inward curvature of your spine (lordosis). A condition that affects your spine, such as spina bifida. You are more likely to develop this condition if you participate in: Gymnastics. Dance. Football. Wrestling. Martial arts. Weight lifting. Tennis. Swimming. What are the signs or symptoms? Symptoms of this condition may include: Long-lasting (chronic) pain in the lower back. Stiffness in the back or legs. Tightness in the hamstring muscles, which are in the backs of the thighs. In some cases, there may be no symptoms of this condition. How is this diagnosed? This condition may be diagnosed based on: Your symptoms. Your medical history. A physical exam. Imaging tests, such as: X-rays. CT scan. MRI. How is this treated? This condition may be treated by: Resting. You may be asked to avoid or modify activities that put strain on your back until your symptoms improve. Medicines to help relieve pain. NSAIDs to help reduce swelling and discomfort. Injections of medicine (cortisone) in your back. These injections can  help to relieve pain and numbness. A brace to stabilize and support your back. Physical therapy. You may work with an occupational therapist or physical therapist who can teach you how to reduce pressure on your back while you do everyday activities. Surgery. This may be needed if you have: A severe injury. Pain that lasts for more than 6 months. Numbness in your pelvic region. Changes in control of your stool or urine. Follow these instructions at home: Medicines Take over-the-counter and prescription medicines only as told by your health care provider. Ask your health care provider if the medicine prescribed to you: Requires you to avoid driving or using heavy machinery. Can cause constipation. You may need to take these actions to prevent or treat constipation: Drink enough fluid to keep your urine pale yellow. Take over-the-counter or prescription medicines. Eat foods that are high in fiber, such as beans, whole grains, and fresh fruits and vegetables. Limit foods that are high in fat and processed sugars, such as fried or sweet foods. If you have a brace: Wear the brace as told by your health care provider. Remove it only as told by your health care provider. Keep the brace clean. If the brace is not waterproof: Do not let it get wet. Cover it with a watertight covering when you take a bath or a shower. Activity Rest and return to your normal activities as told by your health care provider. Ask your health care provider what activities are safe for you. Ask your health care provider when it is safe to drive if you have a back brace. Work with a physical therapist to make a safe exercise program, as recommended by your health care  provider. Do exercises as told by your physical therapist. This may include exercises to strengthen your back and abdominal muscles (core exercises). Managing pain, stiffness, and swelling   If directed, put ice on the affected area. If you have a  removable brace, remove it as told by your health care provider. Put ice in a plastic bag. Place a towel between your skin and the bag. Leave the ice on for 20 minutes, 2-3 times a day. If directed, apply heat to the affected area as often as told by your health care provider. Use the heat source that your health care provider recommends, such as a moist heat pack or a heating pad. If you have a removable brace, remove it as told by your health care provider. Place a towel between your skin and the heat source. Leave the heat on for 20-30 minutes. Remove the heat if your skin turns bright red. This is especially important if you are unable to feel pain, heat, or cold. You may have a greater risk of getting burned. General instructions Do not use any products that contain nicotine or tobacco, such as cigarettes, e-cigarettes, and chewing tobacco. These can delay bone healing. If you need help quitting, ask your health care provider. Maintain a healthy weight. Extra weight puts stress on your back. Keep all follow-up visits as told by your health care provider. This is important. Contact a health care provider if: You have pain that gets worse or does not get better. Get help right away if: You have severe back pain. You have changes in control of your stool or urine. You develop weakness or numbness in your legs. You are unable to stand or walk. Summary Spondylolysis is a small break or crack (stress fracture) in a bone in the spine (vertebra) in the lower back (lumbar spine). This condition may be treated by resting, medicines, physical therapy, wearing a brace, or surgery. Rest and return to your normal activities as told by your health care provider. Ask your health care provider what activities are safe for you. Contact a health care provider if you have pain that gets worse or does not get better. This information is not intended to replace advice given to you by your health care  provider. Make sure you discuss any questions you have with your health care provider. Document Revised: 08/14/2018 Document Reviewed: 11/26/2017 Elsevier Patient Education  [place].

## 2021-02-10 ENCOUNTER — Telehealth: Payer: Self-pay | Admitting: Internal Medicine

## 2021-02-10 NOTE — Telephone Encounter (Signed)
1.Medication Requested: Oxycodone HCl 10 MG TABS predniSONE (DELTASONE) 10 MG tablet pyridostigmine (MESTINON) 60 MG tablet 2. Pharmacy (Name, [place]): [id number] - [address], [place] Phone:  [phone]  Fax:  [phone]     3. On Med List: Y  4. Last Visit with PCP: 02/09/2021  5. Next visit date with PCP: n/a   Agent: Please be advised that RX refills may take up to 3 business days. We ask that you follow-up with your pharmacy.

## 2021-02-10 NOTE — Telephone Encounter (Signed)
Called pt, LVM to discuss.

## 2021-02-13 ENCOUNTER — Telehealth: Payer: Self-pay

## 2021-02-13 ENCOUNTER — Other Ambulatory Visit: Payer: Self-pay | Admitting: Internal Medicine

## 2021-02-13 DIAGNOSIS — M5441 Lumbago with sciatica, right side: Secondary | ICD-10-CM

## 2021-02-13 DIAGNOSIS — G8929 Other chronic pain: Secondary | ICD-10-CM

## 2021-02-13 MED ORDER — OXYCODONE HCL 10 MG PO TABS: 10.0000 mg | ORAL_TABLET | Freq: Two times a day (BID) | ORAL | 0 refills | Status: DC

## 2021-02-13 NOTE — Addendum Note (Signed)
Addended by: Janith Lima on: 02/13/2021 11:01 AM   Modules accepted: Level of Service

## 2021-02-13 NOTE — Telephone Encounter (Signed)
Pleas advise as the pt has asked for a refill of his Oxycodone HCl 10 MG TABS   LOV:02/09/2021.

## 2021-02-17 ENCOUNTER — Telehealth: Payer: Self-pay | Admitting: Neurology

## 2021-02-17 ENCOUNTER — Ambulatory Visit (INDEPENDENT_AMBULATORY_CARE_PROVIDER_SITE_OTHER): Payer: 59 | Admitting: Neurology

## 2021-02-17 ENCOUNTER — Encounter: Payer: Self-pay | Admitting: Neurology

## 2021-02-17 VITALS — BP 112/70 | HR 75 | Ht 73.0 in | Wt 259.0 lb

## 2021-02-17 DIAGNOSIS — G7000 Myasthenia gravis without (acute) exacerbation: Secondary | ICD-10-CM

## 2021-02-17 DIAGNOSIS — R0602 Shortness of breath: Secondary | ICD-10-CM | POA: Diagnosis not present

## 2021-02-17 DIAGNOSIS — R0683 Snoring: Secondary | ICD-10-CM | POA: Insufficient documentation

## 2021-02-17 MED ORDER — TIZANIDINE HCL 4 MG PO TABS: 4.0000 mg | ORAL_TABLET | Freq: Two times a day (BID) | ORAL | 6 refills | Status: DC | PRN

## 2021-02-17 NOTE — Telephone Encounter (Signed)
           AChR Abs with Reflex to [facility]                 Routine, Clinic Collect, Future, Expires: 02/17/2022   Please call patient for above laboratory evaluations

## 2021-02-17 NOTE — Progress Notes (Signed)
Chief Complaint  Patient presents with   New Patient (Initial Visit)    Pt alone, rm 16. Referred by PCP. Pt was diagnosed in 2011/2012 with MG. He states that his symptoms started to flare up couple months ago. He struggles with his breathing. He states that when he exerts himself is when it is at its worse. states that he is having problems with lower back. 2 yrs ago he was started on tizanidine to help with this and eased off. Images were     ASSESSMENT AND PLAN  Russell Green is a 50 y.o. male   Seropositive generalized myasthenia gravis  Previous significant bulbar, ocular, limb muscle weakness, required prolonged intubation, tracheostomy in 2012, was treated by [facility] Dr. Nadara Mustard  Significant improvement following plasma exchange,  CT chest that showed no thymus pathology  From patient and record, there is no evidence of IVIG, and steroid sparing agent treatment in the past,   Worry about the long-term side effect of steroid sparing agent such as CellCept, Imuran, he wants to hold all dose agent right now  Still under suboptimal control despite long-term large dose of prednisone, multiple previous attempt lowering the prednisone would cause recurrent worsening weakness, discussed with patient, decided to proceed with IVIG at this point, 2 g/kg divided into 4 days as a loading dose, followed by maintenance dose 1 g/kg every 3 to 4 weeks in 2 days   Continue prednisone 30 mg daily, Mestinon 60 mg 3 times a day(every 4 hours)  Acetylcholine receptor binding antibody was negative, will repeat full myasthenia gravis panel Frequent shortness of breath  Refer to pulmonologist , pulmonary functional test for baseline Snoring, extreme daytime fatigue sleepiness  Patient does has obesity, risk for obstructive sleep apnea, refer to sleep study  DIAGNOSTIC DATA (LABS, IMAGING, TESTING) - I reviewed patient records, labs, notes, testing and imaging myself where available. Laboratory evaluation in  2022, normal CMP, CBC, HIV, TSH, A1c, lipid panel, negative acetylcholine binding antibody,  HISTORICAL  Russell Green is a 50 year old male, seen in request by his primary care physician Dr. Scarlette Calico for evaluation of myasthenia gravis, initial evaluation was on July 06, 2020  I reviewed and summarized the referring note.  He began to develop symptoms around 2010, described difficulty chewing, significant weight loss, double vision, voice change, eventually went into respiratory failure about 18 months after his symptom onset, while he was incarcerated in June 2012, he was treated at [facility], required prolonged intubation, tracheostomy, was diagnosed with myasthenia gravis, he was under the care of Dr. Alysia Penna team, I have limited access to 2012 initial evaluation, per patient, his symptoms began to gradually improve following 1 round of plasma exchange  He was put on high-dose of prednisone 60 mg along with Mestinon 60 mg 3 times daily, there was discussion of long-term steroid sparing agent such as CellCept and Imuran, he worried about the side effect, also because of his social situation, he has been kept on prednisone since it started in 2012  Per patient, over the years, attempt to lower dose of prednisone below 30 mg, or lower Mestinon dose below 30 mg 3 times daily, he would have recurrent symptoms such as double vision, chewing difficulty, increased fatigue, lack of stamina, limb muscle weakness  Last visit with Dr. Nadara Mustard was in January 2019, reported suboptimal control of his myasthenia symptoms, exertional fatigue, limit his ability to perform tasks around [place], climb stairs, daily double vision, while taking prednisone 30  mg daily, Mestinon 60 mg 3 times a day, Multiple spirometric trials are performed. On best effort the following are obtained: FVC: 3.21L, FEV1: 2.50L, FEV1%: 78%. The interpretation is of moderate restriction.   Following with Dr. Lyman Speller  retirement, he was followed by Dr.Anahit Cheluskin Mehrabyan, most recent visit was in November 2020  [place] was performed on the left Extensor Digitorum in 2019. The study was performed 72 hours following his last dose of pyridostigmine and while taking daily prednisone. Seventeen fiber pairs were examined and the MCD was 26.9 microSec. The Median MCD was 27.8 microSec. Normal jitter was seen in 17 of 17 (100%) pairs, increased jitter without impulse blocking was seen in 0 of 17 (0%) pairs, and impulse blocking was not observed (0%).    Single Fiber EMG recordings demonstrate increased MCD and percent fiberpairs with increased jitter and blocking in the EDC muscle only.  The Frontalis muscle is normal.   The conclusion was, these electrodiagnostic findings continue to demonstrate anabnormality of neuromuscular transmission in the EDC muscle.  Quantitatively there has been slight worsening in the EDC muscle while the Frontalis muscleis not changed.      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, assistant patient, with repetitive movement, he continues to complain shortness of breath, exertion, muscle weakness, at the end of the day, usually developed double vision especially looking to the left side, denies chewing difficulty, but try to avoid tough meat,  Laboratory evaluations in 2022, lipid panel, elevated LDL 109, cholesterol 218, PSA 1.1, normal TSH 2.0, negative HIV, hepatitis A, B, C, hepatitis B surface antigen was nonreactive, antibody was more than 1 solvent, A1c was 6.3, normal liver functional test, creatinine 1.28, hemoglobin 15,  Per record, previous multiple CT chest that showed no thymus pathology  UPDATE Feb 17 2021: He has continued on prednisone 30 mg daily, previous discussion about steroid sparing agent, such as CellCept, Imuran, he worries about the potential side effect, also  continued on Mestinon 60 mg 3 times a day, last dose was before bedtime, he denies difficulty sleeping, sleeps on 1 pillow, no shortness of breath during sleep, but has frequent snoring  He continue complains of intermittent weakness, fatigue, he is patient aid, sometimes he has difficulty helping patient, at the end of the day he often has intermittent double vision, extreme fatigue, Denies swallowing difficulty, but complains of frequent shortness of breath, sometimes he felt like difficulty to get air in PHYSICAL EXAM   Vitals:   02/17/21 0920  BP: 112/70  Pulse: 75  Weight: 259 lb (117.5 kg)  Height: 6' 1"  (1.854 m)   Not recorded     Body mass index is 34.17 kg/m.  PHYSICAL EXAMNIATION:  Gen: NAD, conversant, well nourised, well groomed                     Cardiovascular: Regular rate rhythm, no peripheral edema, warm, nontender. Eyes: Conjunctivae clear without exudates or hemorrhage Neck: Supple, no carotid bruits. Pulmonary: Clear to auscultation bilaterally   NEUROLOGICAL EXAM:  MENTAL STATUS: Speech/cognition: Awake, alert, oriented to history taking care of conversation   CRANIAL NERVES: CN II: Visual fields are full to confrontation. Pupils are round equal and briskly reactive to light. CN III, IV, VI: extraocular movement are normal. No ptosis.  Cover and uncover testing/Red lens testing showed right super/exotropia, left inferior/exotropia, weakness of bilateral lateral  rectus muscle CN V: Facial sensation is intact to light touch CN VII: Mild eye closure, Radell puff weakness CN VIII: Hearing is normal to causal conversation. CN IX, X: Phonation is normal. CN XI: Head turning and shoulder shrug are intact  MOTOR: Mild neck flexion, shoulder abduction weakness  REFLEXES: Reflexes are 2+ and symmetric at the biceps, triceps, knees, and ankles. Plantar responses are flexor.  SENSORY: Intact to light touch, pinprick and vibratory sensation are intact in  fingers and toes.  COORDINATION: There is no trunk or limb dysmetria noted.  GAIT/STANCE: He can get up from seated without assistant, but on the third attempt, he required multiple attempts, steady gait  REVIEW OF SYSTEMS: Full 14 system review of systems performed and notable only for as above All other review of systems were negative.  ALLERGIES: No Known Allergies  HOME MEDICATIONS: Current Outpatient Medications  Medication Sig Dispense Refill   acetaminophen (TYLENOL) 325 MG tablet Take 650 mg by mouth every 6 (six) hours as needed for mild pain, fever or headache.     omeprazole (PRILOSEC) 20 MG capsule Take 20 mg by mouth daily.     Oxycodone HCl 10 MG TABS Take 1 tablet (10 mg total) by mouth 2 (two) times daily. 60 tablet 0   predniSONE (DELTASONE) 10 MG tablet Take 3 tablets (30 mg total) by mouth daily with breakfast. Take 40 mg p.o. daily for 3 days, then, continue usual dosing of 30 mg p.o. daily. 90 tablet 5   pyridostigmine (MESTINON) 60 MG tablet Take 1 tablet (60 mg total) by mouth 3 (three) times daily. 90 tablet 5   metoprolol tartrate (LOPRESSOR) 25 MG tablet Take 1 tablet (25 mg total) by mouth 2 (two) times daily. 180 tablet 3   No current facility-administered medications for this visit.    PAST MEDICAL HISTORY: Past Medical History:  Diagnosis Date   Asthma    childhood issue   Atrial fibrillation, transient ([place])    Myasthenia gravis ([place]) .   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   Right-handed.   Three cups caffeine per day.   Social  Determinants of Health   Financial Resource Strain: Not on file  Food Insecurity: Not on file  Transportation Needs: Not on file  Physical Activity: Not on file  Stress: Not on file  Social Connections: Not on file  Intimate Partner Violence: Not on file      Marcial Pacas, M.D. Ph.D.  [facility] [address], [place], [place] [address] Ph: [phone] Fax: [phone]  CC:  Janith Lima, MD [address] [place],  [place] [address]

## 2021-02-17 NOTE — Telephone Encounter (Signed)
Please start IVIG ordered:  2 g/kg as initial loading dose, divided into 4 days,= 0.5 g/kgx4  Followed by 1 g/kg as maintenance dose every 3 to 4 weeks= 0.5 g/kgx2

## 2021-02-17 NOTE — Patient Instructions (Addendum)
Mechanism of Action  a) Immune globulin-klhw supplies a broad spectrum of opsonizing and neutralizing immunoglobulin G (IgG) antibodies against bacterial, viral, parasitic, and mycoplasmal agents and their toxins. Immune globulin-klhw also contains a spectrum of antibodies capable of interacting with and altering the activity of cells of the immune system. The role of these antibodies and the mechanism of action of immune globulin-klhw are not fully understood. Human normal immunoglobulin contains mainly IgG with a broad spectrum of antibodies against infectious agents and contains the IgG antibodies present in the normal population. Immune globulin-klhw has a distribution of IgG subclasses closely proportional to that of native human plasma  Common Side Effect Cardiovascular: Heart murmur (6.6% ), Hypertension (3.4% to 8.8% ), Hypotension (Primary humoral immunodeficiency, 5% ; primary immune deficiency disorder 5% to 22% ), Increased blood pressure (6% to 8% ), Increased heart rate (5% to 22% ), Increased systolic arterial pressure (6.4% ), Peripheral edema (8.2% ) Dermatologic: Flushing (5.6% to 5.9% Primary immunodeficiency (IV route): 5.9% with immune globulin (N=17) ), Injection site disorder (5% to 100% ), Pruritus (6% to 8% ), Rash (4.1% to 8.3% ), Swelling at injection site (8.3% ), Urticaria (5% to 8.2% ) Endocrine metabolic: Increased body temperature (primary humoral immunodeficiency, 7.3% to 9% ; CITP, 36.8% ) Gastrointestinal: Aphthous ulcer of mouth (6.4% ), Diarrhea (6% to 28% ), Nausea (5% to 26% ), Upper abdominal pain (3.9% to 10.6% ), Vomiting (7% to 23% ) Immunologic: Infusion reaction (7% ) Musculoskeletal: Arthralgia (1.7% to 13% ), Muscle weakness (6.82% ), Myalgia (5% to 20% ), Pain in limb (1.8% to 11.5% ), Spasm (6% to 6.8% ) Neurologic: Asthenia (5% to 10% ), Dizziness (Up to 13.1% ), Headache (6.9% to 75% ), Lethargy (6% ), Migraine (5% to 6.6% ) Otic: Otalgia (6.4% to 18%  ) Respiratory: Asthma (8.5% to 29% ), Cough (6% to 26% ), Nasal congestion (13% to 15% ), Pain in throat (6.4% to 9.1% ), Pharyngitis (5% to 16.1% ), Pharyngolaryngitis (5% to 15% ), Rhinitis (24.1% ), Sinusitis (2% to 16% ), Wheezing (9% to 9.1% ) Other: Dehydration (5.7% ), Fatigue (5.9% to 24% ), Fever (3% to 37% ), Pain (5% to 13% ), Rigor (13.1% to 37% ), Shivering (5% to 19.4% ) Serious Cardiovascular: Chest discomfort (5% to 9% ), Chest pain (5% to 11% ), Myocardial infarction, Tachycardia (Primary humoral immunodeficiency, 5% ; primary immune deficiency disorder 22% ) Endocrine metabolic: Hyponatremia Hematologic: Hemolysis, Hemolytic anemia, Thrombosis (Primary humoral immunodeficiency, 2%; immune thrombocytopenic purpura, 22% ) Hepatic: Hepatitis C Immunologic: Anaphylaxis Musculoskeletal: Backache (1.7% to 28% ) Neurologic: Aseptic meningitis (1.6% ) Renal: Acute renal failure, Hypokalemic nephropathy, Tubular necrosis, acute Respiratory: Acute lung injury during and following administration of blood product, Pulmonary embolism (0.9% )   Please change of Mestinon scheduled to early morning, noon, early evening  Cellcept

## 2021-02-20 ENCOUNTER — Other Ambulatory Visit: Payer: Self-pay

## 2021-02-20 ENCOUNTER — Other Ambulatory Visit (INDEPENDENT_AMBULATORY_CARE_PROVIDER_SITE_OTHER): Payer: 59

## 2021-02-20 DIAGNOSIS — R7303 Prediabetes: Secondary | ICD-10-CM | POA: Diagnosis not present

## 2021-02-20 LAB — BASIC METABOLIC PANEL
BUN: 15 mg/dL (ref 6–23)
CO2: 30 mEq/L (ref 19–32)
Calcium: 9.2 mg/dL (ref 8.4–10.5)
Chloride: 101 mEq/L (ref 96–112)
Creatinine, Ser: 1.34 mg/dL (ref 0.40–1.50)
GFR: 62.29 mL/min (ref >60.00)
Glucose, Bld: 168 mg/dL — ABNORMAL HIGH (ref 70–99)
Potassium: 4 mEq/L (ref 3.5–5.1)
Sodium: 139 mEq/L (ref 135–145)

## 2021-02-20 LAB — HEMOGLOBIN A1C: Hgb A1c MFr Bld: 6.4 % (ref 4.6–6.5)

## 2021-02-20 NOTE — Telephone Encounter (Signed)
I spoke to the patient. He will come this week for the additional labs. He aware [facility] will also be working on getting IVIG approved for him.

## 2021-02-20 NOTE — Telephone Encounter (Signed)
Orders signed and provided to [facility].

## 2021-02-21 ENCOUNTER — Other Ambulatory Visit (INDEPENDENT_AMBULATORY_CARE_PROVIDER_SITE_OTHER): Payer: Self-pay

## 2021-02-21 DIAGNOSIS — G7000 Myasthenia gravis without (acute) exacerbation: Secondary | ICD-10-CM

## 2021-02-21 DIAGNOSIS — R0602 Shortness of breath: Secondary | ICD-10-CM

## 2021-02-21 DIAGNOSIS — R0683 Snoring: Secondary | ICD-10-CM

## 2021-02-21 DIAGNOSIS — Z0289 Encounter for other administrative examinations: Secondary | ICD-10-CM

## 2021-02-21 NOTE — Telephone Encounter (Signed)
Per [facility], they will need to get IGA level for the pre-authorization of IVIG. Would you like to place any other labs?

## 2021-02-21 NOTE — Telephone Encounter (Signed)
Unfortunately, per [facility], the IgA has to be processed a specific way. They will be unable to add it to the sample obtained earlier. I have called the patient and he will return to the office for this lab draw.

## 2021-02-21 NOTE — Telephone Encounter (Signed)
He was just here for complete myasthenia gravis panel, I hope they to have enough for IgA,  Orders Placed This Encounter  Procedures   IgA

## 2021-02-21 NOTE — Addendum Note (Signed)
Addended by: Marcial Pacas on: 02/21/2021 10:33 AM   Modules accepted: Orders

## 2021-03-01 LAB — MUSK ANTIBODIES: MuSK Antibodies: <1 U/mL

## 2021-03-01 LAB — ACHR ABS WITH REFLEX TO MUSK: AChR Binding Ab, Serum: <0.03 nmol/L (ref 0.00–0.24)

## 2021-03-02 NOTE — Telephone Encounter (Signed)
Called pt. Relayed results per Dr. Rhea Belton note. He verbalized understanding. He has not come back to get IgA level. He asked about coming tomorrow. Aware we normally are closed Friday's but open tomorrow 8-12p. He is going to take lunch break early and come. I placed him on lab schedule at 1130am.

## 2021-03-03 ENCOUNTER — Other Ambulatory Visit: Payer: 59

## 2021-03-07 ENCOUNTER — Telehealth: Payer: Self-pay | Admitting: *Deleted

## 2021-03-07 NOTE — Telephone Encounter (Signed)
Left a message reminding him that we need additional labs (IgA level). The results are required for the IVIG to be approved.

## 2021-03-15 NOTE — Telephone Encounter (Signed)
Multiple attempts to reach patient for labs. Another message left today asking him to come to the office. Contact number and office hours have been provided.

## 2021-03-20 ENCOUNTER — Telehealth: Payer: Self-pay | Admitting: Internal Medicine

## 2021-03-20 NOTE — Telephone Encounter (Signed)
Patient states pain management clinic he was recently referred to does not accept his insurance  Patient requesting referral to [facility], patient states clinic accepts his insurance

## 2021-03-20 NOTE — Telephone Encounter (Signed)
1.Medication Requested: Oxycodone HCl 10 MG TABS  2. Pharmacy (Name, [place], [place]): [place], [place]  3. On Med List: yes   4. Last Visit with PCP: 02-09-2021  5. Next visit date with PCP: n/a   Agent: Please be advised that RX refills may take up to 3 business days. We ask that you follow-up with your pharmacy.

## 2021-03-27 NOTE — Telephone Encounter (Signed)
Patient calling in to check status of refill request  Please let patient know

## 2021-04-13 ENCOUNTER — Other Ambulatory Visit: Payer: Self-pay | Admitting: Neurology

## 2021-06-06 ENCOUNTER — Ambulatory Visit: Payer: Self-pay | Admitting: Neurology

## 2021-06-21 ENCOUNTER — Encounter (HOSPITAL_COMMUNITY): Payer: Self-pay

## 2021-06-21 ENCOUNTER — Ambulatory Visit (HOSPITAL_COMMUNITY)
Admission: EM | Admit: 2021-06-21 | Discharge: 2021-06-21 | Disposition: A | Discharge status: Home / Self Care | Payer: Managed Care, Other (non HMO) | Attending: Emergency Medicine | Admitting: Emergency Medicine

## 2021-06-21 ENCOUNTER — Other Ambulatory Visit: Payer: Self-pay

## 2021-06-21 DIAGNOSIS — M62830 Muscle spasm of back: Secondary | ICD-10-CM | POA: Diagnosis not present

## 2021-06-21 DIAGNOSIS — T148XXA Other injury of unspecified body region, initial encounter: Secondary | ICD-10-CM

## 2021-06-21 MED ORDER — KETOROLAC TROMETHAMINE 30 MG/ML IJ SOLN
30.0000 mg | Freq: Once | INTRAMUSCULAR | Status: AC
  Administered 2021-06-21: 30 mg via INTRAMUSCULAR

## 2021-06-21 MED ORDER — KETOROLAC TROMETHAMINE 30 MG/ML IJ SOLN
INTRAMUSCULAR | Status: AC
  Filled 2021-06-21: qty 1

## 2021-06-21 MED ORDER — TIZANIDINE HCL 4 MG PO TABS: 4.0000 mg | ORAL_TABLET | Freq: Two times a day (BID) | ORAL | 2 refills | Status: DC | PRN

## 2021-06-21 MED ORDER — NAPROXEN 375 MG PO TABS
375.0000 mg | ORAL_TABLET | Freq: Two times a day (BID) | ORAL | 0 refills | Status: DC
Start: 2021-06-21 — End: 2021-08-09

## 2021-06-21 NOTE — ED Triage Notes (Signed)
Pt presents for back problems that started 3 days ago. He is using OTC medication but has not helped.

## 2021-06-21 NOTE — ED Provider Notes (Addendum)
[facility]    CSN: [id number] Arrival date & time: 06/21/21  1432      History   Chief Complaint Chief Complaint  Patient presents with   Back Pain    HPI Russell Green is a 51 y.o. male. Pt reports using a fly swatter on 06/18/21 and when he swung it, he felt pain in his L lower back that caused him to fall to his knees it was so severe. Does have chronic back pain but states this pain is different. Has tried aspirin and ibuprofen for no relief of pain. Takes oxycodone for chronic pain that has not helped at all. Denies numbness or weakness. States pain is worst in the morning after being still all night long. Has been using a heating pad for no relief.    Back Pain  Past Medical History:  Diagnosis Date   Asthma    childhood issue   Atrial fibrillation, transient ([place])    Myasthenia gravis ([place]) .   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 Authorizing Provider  naproxen (NAPROSYN) 375 MG tablet Take 1 tablet (375 mg total) by mouth 2 (two) times daily. 06/21/21  Yes Carvel Getting, NP  acetaminophen (TYLENOL) 325 MG tablet Take 650 mg by mouth every 6 (six) hours as needed for mild pain, fever or headache.     [provider]  omeprazole (PRILOSEC) 20 MG capsule Take 20 mg by mouth daily.    [provider]  Oxycodone HCl 10 MG TABS Take 1 tablet (10 mg total) by mouth 2 (two) times daily. 02/13/21   Janith Lima, MD  predniSONE (DELTASONE) 10 MG tablet TAKE 4 TABLETS BY MOUTH DAILY FOR 3 DAYS THEN CONTINUE USUAL DOSING OF 3 TABLETS DAILY. 04/13/21   Marcial Pacas, MD  pyridostigmine (MESTINON) 60 MG tablet TAKE 1 TABLET BY MOUTH 3 TIMES DAILY 04/13/21   Marcial Pacas, MD  tiZANidine (ZANAFLEX) 4 MG tablet Take 1 tablet (4 mg total) by mouth 2 (two) times daily as needed for muscle spasms. 06/21/21   Carvel Getting, NP    Family History Family History  Problem Relation Age of Onset   Cancer Mother        unsure of type   Other Father        unsure of medical history   Healthy Sister    Healthy Brother     Social History Social History   Tobacco Use   Smoking status: Former    Packs/day: 0.25    Years: 10.00    Pack years: 2.50    Types: Cigarettes    Quit date: 1995    Years since quitting: 28.1  Smokeless tobacco: Never  Vaping Use   Vaping Use: Never used  Substance Use Topics   Alcohol use: No   Drug use: No     Allergies   Patient has no known allergies.   Review of Systems Review of Systems  Musculoskeletal:  Positive for back pain.    Physical Exam Triage Vital Signs ED Triage Vitals [06/21/21 1608]  Enc Vitals Group     BP 133/83     Pulse Rate 78     Resp 18     Temp 98.3 F (36.8 C)     Temp Source Oral     SpO2 100 %     Weight      Height      Head Circumference      Peak Flow      Pain Score 6     Pain Loc      Pain Edu?      Excl. in [place]?    No data found.  Updated Vital Signs BP 133/83 (BP Location: Left Arm)    Pulse 78    Temp 98.3 F (36.8 C) (Oral)    Resp 18    SpO2 100%   Visual Acuity Right Eye Distance:   Left Eye Distance:   Bilateral Distance:    Right Eye Near:   Left Eye Near:    Bilateral Near:      Physical Exam Constitutional:      General: He is not in acute distress.    Appearance: Normal appearance. He is not ill-appearing.  Pulmonary:     Effort: Pulmonary effort is normal.  Musculoskeletal:     Lumbar back: Spasms and tenderness present. No bony tenderness.       Back:  Neurological:     Mental Status: He is alert and oriented to person, place, and time.     Gait: Gait normal.     UC Treatments / Results  Labs (all labs ordered are listed, but only abnormal results are displayed) Labs Reviewed - No data to display  EKG   Radiology No results found.  Procedures Procedures (including critical care time)  Medications Ordered in UC Medications  ketorolac (TORADOL) 30 MG/ML injection 30 mg (30 mg Intramuscular Given 06/21/21 1651)    Initial Impression / Assessment and Plan / UC Course  I have reviewed the triage vital signs and the nursing notes.  Pertinent labs & imaging results that were available during my care of the patient were reviewed by me and considered in my medical decision making (see chart for details).    Given shot of toradol. Pt has been taking tizanidine twice a day but is out in the last two weeks  - I refilled rx. Discussed proper use of heat therapy, discussed ok to gently stretch and move.   Final Clinical Impressions(s) / UC Diagnoses   Final diagnoses:  Muscle strain  Muscle spasm of back     Discharge Instructions      You can use your tizanidine muscle relaxer up to 4 times a day for muscle spasms. Use heat therapy per attached instructions.   Follow up your primary provider or sports medicine if you are not getting better.    ED Prescriptions     Medication Sig Dispense Auth. Provider   naproxen (NAPROSYN) 375 MG tablet Take 1 tablet (375 mg total) by mouth 2 (two) times daily. 20 tablet Carvel Getting, NP   tiZANidine (ZANAFLEX) 4 MG tablet Take  1 tablet (4 mg total) by mouth 2 (two) times daily as needed for  muscle spasms. 60 tablet Carvel Getting, NP      PDMP not reviewed this encounter.   Carvel Getting, NP 06/21/21 1727    Carvel Getting, NP 06/22/21 [phone]

## 2021-06-21 NOTE — Discharge Instructions (Addendum)
You can use your tizanidine muscle relaxer up to 4 times a day for muscle spasms. Use heat therapy per attached instructions.   Follow up your primary provider or sports medicine if you are not getting better.

## 2021-06-27 ENCOUNTER — Other Ambulatory Visit: Payer: Self-pay | Admitting: Anesthesiology

## 2021-06-27 DIAGNOSIS — S39012A Strain of muscle, fascia and tendon of lower back, initial encounter: Secondary | ICD-10-CM

## 2021-06-27 DIAGNOSIS — M545 Low back pain, unspecified: Secondary | ICD-10-CM

## 2021-07-06 ENCOUNTER — Other Ambulatory Visit: Payer: Self-pay

## 2021-07-07 ENCOUNTER — Other Ambulatory Visit: Payer: Self-pay

## 2021-08-08 NOTE — Progress Notes (Signed)
[facility]: Clinic Note ? ?Subjective: ? ?History of Present Illness: Russell Green is a 51 y.o. year old male who presents for establishing care after recent insurance change. ? ?His main goal today is to establish care with a new PCP and a new neurologist, with refills for his medicines. ? ?Otherwise, he is doing well.  ? ?Please refer to Assessment and Plan below for full details in Problem-Based Charting.  ? ?Past Medical History:  ?Patient Active Problem List  ? Diagnosis Date Noted  ? Prediabetes 02/09/2021  ? Chronic midline low back pain with bilateral sciatica 06/29/2020  ? Primary osteoarthritis of left knee 06/29/2020  ? Pain medication agreement signed 12/21/2019  ? Healthcare maintenance 12/17/2011  ? Myasthenia gravis without exacerbation ([place]) 02/19/2011  ?  ?Social History: Reviewed in [place]. Pertinent updates today include: No current tobacco use. Recently changed insurances, so needs to re-establish care. ? ?Family History: Reviewed in Epic. Pertinent updates today include: None ? ?Medications:  ?Current Outpatient Medications:  ?  acetaminophen (TYLENOL) 325 MG tablet, Take 650 mg by mouth every 6 (six) hours as needed for mild pain, fever or headache., Disp: , Rfl:  ?  cyclobenzaprine (FLEXERIL) 10 MG tablet, Take 10 mg by mouth daily., Disp: , Rfl:  ?  diclofenac (VOLTAREN) 75 MG EC tablet, SMARTSIG:1 Tablet(s) By Mouth Every 12 Hours, Disp: , Rfl:  ?  omeprazole (PRILOSEC) 20 MG capsule, Take 20 mg by mouth daily., Disp: , Rfl:  ?  Oxycodone HCl 10 MG TABS, Take 1 tablet (10 mg total) by mouth 2 (two) times daily., Disp: 60 tablet, Rfl: 0 ?  predniSONE (DELTASONE) 10 MG tablet, Take 1 tablet (10 mg total) by mouth daily with breakfast., Disp: 90 tablet, Rfl: 3 ?  pyridostigmine (MESTINON) 60 MG tablet, Take 0.5 tablets (30 mg total) by mouth 3 (three) times daily., Disp: 90 tablet, Rfl: 3  ? ?Allergies: ?No Known Allergies  ? ?Review of Systems: ?Review of Systems   ?Constitutional:  Negative for malaise/fatigue and weight loss.  ?Respiratory:  Negative for cough and shortness of breath.   ?Cardiovascular:  Negative for chest pain, palpitations and leg swelling.  ?Neurological:  Negative for focal weakness and weakness.   ? ?Objective:  ? ?Vitals: ?Vitals:  ? 08/09/21 1000  ?BP: 124/80  ?Pulse: 84  ?Temp: 98.1 ?F (36.7 ?C)  ?SpO2: 100%  ?  ?Physical Exam: ?Physical Exam ?Constitutional:   ?   General: He is not in acute distress. ?   Appearance: Normal appearance.  ?HENT:  ?   Head: Normocephalic and atraumatic.  ?Cardiovascular:  ?   Rate and Rhythm: Normal rate and regular rhythm.  ?   Heart sounds: Normal heart sounds. No murmur heard. ?Pulmonary:  ?   Effort: Pulmonary effort is normal.  ?   Breath sounds: Normal breath sounds. No wheezing, rhonchi or rales.  ?Musculoskeletal:  ?   Right lower leg: No edema.  ?   Left lower leg: No edema.  ?Skin: ?   General: Skin is warm and dry.  ?   Findings: No erythema.  ?Neurological:  ?   Mental Status: He is alert.  ?  ? ?Data: Labs, imaging, and micro were reviewed in Epic. Refer to Assessment and Plan below for full details in Problem-Based Charting. ? ?Assessment & Plan: ? ?Myasthenia gravis without exacerbation ([place]) ?- Had a long period where doctors didn't know what was wrong with him before finally being diagnosed with MG  in 2012 ?- Significant bulbar, ocular, limb muscle weakness, and required prolonged intubation with tracheostomy in 2012 ?- Was first treated by Dr. Nadara Mustard at [facility] ?- Patient tells me today that he and Dr. Nadara Mustard had attempted some steroid-sparing agents in the past, but he did not tolerate those, so needed to remain on Prednisone. They assessed risk/benefits and 92m daily is the lowest dose he can tolerate without developing symptoms ?- He's been on Pred 177mdaily & Pyridostigmine 3056mID, and feels well, with no symptoms of weakness, fatigue, palpitations, or vision changes ?- Neuro referral today for  MG ?- Refill Prednisone & Pyridostigmine  ? ?Chronic midline low back pain with bilateral sciatica ?- He is followed by Dr. KwaMichaell Cowingr pain management ?- Currently taking OTC Tylenol, Voltaren gel, occasional naproxen, and being prescribed Oxycodone 38m75md Flexeril by pain management ?- No changes to management today ? ?Prediabetes ?- A1C was 6.4 in 06/2021. Patient aware of his diagnosis of pre-diabetes and that being on long-term steroids may be contributing. Continue regular exercise & healthy diet ?- Repeat A1C annually  ? ?Healthcare maintenance ?- Colorectal cancer: cologuard normal in 07/2021 ?- Prostate cancer: PSA 1.1 in 06/2021, repeat annually  ? ?Vaccines ?- discuss covid vaccine at future visit  ?  ? ? ?Patient will follow up in 6-9 months ? ?Russell Green   ?

## 2021-08-09 ENCOUNTER — Ambulatory Visit (INDEPENDENT_AMBULATORY_CARE_PROVIDER_SITE_OTHER): Payer: 59 | Admitting: Internal Medicine

## 2021-08-09 ENCOUNTER — Encounter: Payer: Self-pay | Admitting: Internal Medicine

## 2021-08-09 VITALS — BP 124/80 | HR 84 | Temp 98.1°F | Ht 73.0 in | Wt 253.5 lb

## 2021-08-09 DIAGNOSIS — M5441 Lumbago with sciatica, right side: Secondary | ICD-10-CM | POA: Diagnosis not present

## 2021-08-09 DIAGNOSIS — R7303 Prediabetes: Secondary | ICD-10-CM

## 2021-08-09 DIAGNOSIS — M5442 Lumbago with sciatica, left side: Secondary | ICD-10-CM

## 2021-08-09 DIAGNOSIS — G7000 Myasthenia gravis without (acute) exacerbation: Secondary | ICD-10-CM | POA: Diagnosis not present

## 2021-08-09 DIAGNOSIS — G8929 Other chronic pain: Secondary | ICD-10-CM | POA: Diagnosis not present

## 2021-08-09 DIAGNOSIS — Z0000 Encounter for general adult medical examination without abnormal findings: Secondary | ICD-10-CM

## 2021-08-09 MED ORDER — PYRIDOSTIGMINE BROMIDE 60 MG PO TABS: 30.0000 mg | ORAL_TABLET | Freq: Three times a day (TID) | ORAL | 3 refills | Status: DC

## 2021-08-09 MED ORDER — PREDNISONE 10 MG PO TABS: 10.0000 mg | ORAL_TABLET | Freq: Every day | ORAL | 3 refills | Status: DC

## 2021-08-09 NOTE — Patient Instructions (Signed)
It was a pleasure seeing you today. ? ?I have placed a referral to Neurology - you should receive a call from them. ? ?I have sent your medicines to your pharmacy. ? ?No blood work needed today. ? ?I'll see you back in 6 months. ?

## 2021-08-09 NOTE — Assessment & Plan Note (Signed)
-   Had a long period where doctors didn't know what was wrong with him before finally being diagnosed with MG in 2012 ?- Significant bulbar, ocular, limb muscle weakness, and required prolonged intubation with tracheostomy in 2012 ?- Was first treated by Dr. Nadara Mustard at [facility] ?- Patient tells me today that he and Dr. Nadara Mustard had attempted some steroid-sparing agents in the past, but he did not tolerate those, so needed to remain on Prednisone. They assessed risk/benefits and 43m daily is the lowest dose he can tolerate without developing symptoms ?- He's been on Pred 145mdaily & Pyridostigmine 3017mID, and feels well, with no symptoms of weakness, fatigue, palpitations, or vision changes ?- Neuro referral today for MG ?- Refill Prednisone & Pyridostigmine  ?

## 2021-08-09 NOTE — Assessment & Plan Note (Signed)
-   A1C was 6.4 in 06/2021. Patient aware of his diagnosis of pre-diabetes and that being on long-term steroids may be contributing. Continue regular exercise & healthy diet ?- Repeat A1C annually  ?

## 2021-08-09 NOTE — Assessment & Plan Note (Signed)
-   Colorectal cancer: cologuard normal in 07/2021 ?- Prostate cancer: PSA 1.1 in 06/2021, repeat annually  ? ?Vaccines ?- discuss covid vaccine at future visit  ?

## 2021-08-09 NOTE — Assessment & Plan Note (Signed)
-   He is followed by Dr. Michaell Cowing for pain management ?- Currently taking OTC Tylenol, Voltaren gel, occasional naproxen, and being prescribed Oxycodone 70m and Flexeril by pain management ?- No changes to management today ?

## 2021-09-11 NOTE — Progress Notes (Signed)
[facility]: Clinic Note ? ?Subjective: ? ?History of Present Illness: Russell Green is a 51 y.o. year old male who presents for an acute care visit for 2-3 weeks of right-sided lower back and abdominal pain.  ? ?He established care with me on 08/09/21. ? ?He was in his baseline state of health until about 2-3 weeks ago, when he developed a throbbing, severe pain in the lower right-side of his back. No preceding trauma. It has gotten worse over the past few weeks. No fevers or chills, no nausea or vomiting, he's tolerating PO well. Pain is constant. It radiates to the right side of his lower abdomen. Associated with increased urinary frequency and painful urination. No blood in the urine, no difficulty urinating. No history of kidney stones. When he's had back pain like this in the past, he needed a steroid injection, which was helpful for him. He hasn't taken any OTC meds yet. ? ?Please refer to Assessment and Plan below for full details in Problem-Based Charting.  ? ?Past Medical History:  ?Patient Active Problem List  ? Diagnosis Date Noted  ? Right lower quadrant pain 09/13/2021  ? Prediabetes 02/09/2021  ? Chronic midline low back pain with bilateral sciatica 06/29/2020  ? Primary osteoarthritis of left knee 06/29/2020  ? Pain medication agreement signed 12/21/2019  ? Healthcare maintenance 12/17/2011  ? Myasthenia gravis without exacerbation ([place]) 02/19/2011  ?  ? ?Medications:  ?Current Outpatient Medications:  ?  acetaminophen (TYLENOL) 325 MG tablet, Take 650 mg by mouth every 6 (six) hours as needed for mild pain, fever or headache., Disp: , Rfl:  ?  diclofenac (VOLTAREN) 75 MG EC tablet, SMARTSIG:1 Tablet(s) By Mouth Every 12 Hours, Disp: , Rfl:  ?  omeprazole (PRILOSEC) 20 MG capsule, Take 20 mg by mouth daily., Disp: , Rfl:  ?  Oxycodone HCl 10 MG TABS, Take 1 tablet (10 mg total) by mouth 2 (two) times daily., Disp: 60 tablet, Rfl: 0 ?  predniSONE (DELTASONE) 10 MG tablet, Take 1  tablet (10 mg total) by mouth daily with breakfast., Disp: 90 tablet, Rfl: 3 ?  pyridostigmine (MESTINON) 60 MG tablet, Take 0.5 tablets (30 mg total) by mouth 3 (three) times daily., Disp: 90 tablet, Rfl: 3 ?  tiZANidine (ZANAFLEX) 4 MG tablet, Take 1 tablet (4 mg total) by mouth daily., Disp: 30 tablet, Rfl: 1  ? ?Allergies: ?No Known Allergies  ?Review of Systems: ?Review of Systems  ?Constitutional:  Negative for chills and fever.  ?Gastrointestinal:  Positive for abdominal pain. Negative for constipation, nausea and vomiting.  ?Genitourinary:  Positive for dysuria and frequency. Negative for flank pain and hematuria.   ? ?Objective:  ? ?Vitals: ?Vitals:  ? 09/13/21 0954  ?BP: 129/76  ?Pulse: 63  ?Temp: 98.4 ?F (36.9 ?C)  ?SpO2: 100%  ?  ?Physical Exam: ?Physical Exam ?Constitutional:   ?   Appearance: Normal appearance.  ?Abdominal:  ?   General: Abdomen is flat. There is no distension.  ?   Palpations: Abdomen is soft. There is no mass.  ?   Tenderness: There is abdominal tenderness. There is no right CVA tenderness, left CVA tenderness, guarding or rebound.  ?   Comments: General RLQ tenderness, not over McBurney's point  ?Musculoskeletal:  ?   Comments: +paraspinal tenderness over right lumbar spine, very lateral  ?Neurological:  ?   Mental Status: He is alert.  ?  ? ?Data: Labs, imaging, and micro were reviewed in Epic. Refer to  Assessment and Plan below for full details in Problem-Based Charting. ? ?U/A negative ? ?Assessment & Plan: ? ?Right lower quadrant pain ?- I'm not sure what is causing patient's acute, progressive, severe lower right back pain and RLQ pain. I considered pyelonephritis or a kidney stone, but he has no CVA tenderness, and his U/A was clean. I also considered appendicitis, but he has no fevers, nausea, or vomiting, the symptoms have been going on for 2-3 weeks, and he had no rebound or guarding on his exam. I think this is probably a musculoskeletal strain, but want to make sure there  isn't something else going on, like an acute infection ?- CBC & CMP today. If elevated WBC, I'll plan on CT A/P ?- Continue home tylenol, voltaren, tizanidine, and oxycodone. We discussed flexeril today, but he has not tolerated this in the past. I'll call him after stat labs result this afternoon to figure out next steps for pain, but not adding any medicines this morning  ?  ? ? ?Patient will follow up in 5 months, as previously scheduled, or sooner if needed ? ?Lottie Mussel, MD   ?

## 2021-09-13 ENCOUNTER — Ambulatory Visit (INDEPENDENT_AMBULATORY_CARE_PROVIDER_SITE_OTHER): Payer: 59 | Admitting: Internal Medicine

## 2021-09-13 ENCOUNTER — Telehealth: Payer: Self-pay | Admitting: Internal Medicine

## 2021-09-13 VITALS — BP 129/76 | HR 63 | Temp 98.4°F | Ht 73.0 in | Wt 248.3 lb

## 2021-09-13 DIAGNOSIS — R350 Frequency of micturition: Secondary | ICD-10-CM

## 2021-09-13 DIAGNOSIS — R1031 Right lower quadrant pain: Secondary | ICD-10-CM | POA: Diagnosis not present

## 2021-09-13 LAB — COMPREHENSIVE METABOLIC PANEL
ALT: 38 U/L (ref 0–44)
AST: 27 U/L (ref 15–41)
Albumin: 3.8 g/dL (ref 3.5–5.0)
Alkaline Phosphatase: 37 U/L — ABNORMAL LOW (ref 38–126)
Anion gap: 6 (ref 5–15)
BUN: 19 mg/dL (ref 6–20)
CO2: 30 mmol/L (ref 22–32)
Calcium: 9.3 mg/dL (ref 8.9–10.3)
Chloride: 104 mmol/L (ref 98–111)
Creatinine, Ser: 1.21 mg/dL (ref 0.61–1.24)
GFR, Estimated: >60 mL/min (ref >60)
Glucose, Bld: 92 mg/dL (ref 70–99)
Potassium: 3.7 mmol/L (ref 3.5–5.1)
Sodium: 140 mmol/L (ref 135–145)
Total Bilirubin: 0.7 mg/dL (ref 0.3–1.2)
Total Protein: 5.8 g/dL — ABNORMAL LOW (ref 6.5–8.1)

## 2021-09-13 LAB — CBC WITH DIFFERENTIAL/PLATELET
Abs Immature Granulocytes: 0.03 10*3/uL (ref 0.00–0.07)
Basophils Absolute: 0.1 10*3/uL (ref 0.0–0.1)
Basophils Relative: 1 %
Eosinophils Absolute: 0.1 10*3/uL (ref 0.0–0.5)
Eosinophils Relative: 2 %
HCT: 46 % (ref 39.0–52.0)
Hemoglobin: 14.5 g/dL (ref 13.0–17.0)
Immature Granulocytes: 1 %
Lymphocytes Relative: 30 %
Lymphs Abs: 1.9 10*3/uL (ref 0.7–4.0)
MCH: 29 pg (ref 26.0–34.0)
MCHC: 31.5 g/dL (ref 30.0–36.0)
MCV: 92 fL (ref 80.0–100.0)
Monocytes Absolute: 0.5 10*3/uL (ref 0.1–1.0)
Monocytes Relative: 8 %
Neutro Abs: 3.7 10*3/uL (ref 1.7–7.7)
Neutrophils Relative %: 58 %
Platelets: 219 10*3/uL (ref 150–400)
RBC: 5 MIL/uL (ref 4.22–5.81)
RDW: 13.3 % (ref 11.5–15.5)
WBC: 6.2 10*3/uL (ref 4.0–10.5)
nRBC: 0 % (ref 0.0–0.2)

## 2021-09-13 LAB — POCT URINALYSIS DIPSTICK
Bilirubin, UA: NEGATIVE
Blood, UA: NEGATIVE
Glucose, UA: NEGATIVE
Ketones, UA: NEGATIVE
Leukocytes, UA: NEGATIVE
Nitrite, UA: NEGATIVE
Protein, UA: NEGATIVE
Spec Grav, UA: 1.025 (ref 1.010–1.025)
Urobilinogen, UA: 0.2 E.U./dL
pH, UA: 5.5 (ref 5.0–8.0)

## 2021-09-13 MED ORDER — TIZANIDINE HCL 4 MG PO TABS: 4.0000 mg | ORAL_TABLET | Freq: Every day | ORAL | 1 refills | Status: DC

## 2021-09-13 MED ORDER — TIZANIDINE HCL 4 MG PO TABS: 4.0000 mg | ORAL_TABLET | Freq: Four times a day (QID) | ORAL | 0 refills | Status: AC | PRN

## 2021-09-13 NOTE — Assessment & Plan Note (Signed)
-   I'm not sure what is causing patient's acute, progressive, severe lower right back pain and RLQ pain. I considered pyelonephritis or a kidney stone, but he has no CVA tenderness, and his U/A was clean. I also considered appendicitis, but he has no fevers, nausea, or vomiting, the symptoms have been going on for 2-3 weeks, and he had no rebound or guarding on his exam. I think this is probably a musculoskeletal strain, but want to make sure there isn't something else going on, like an acute infection ?- CBC & CMP today. If elevated WBC, I'll plan on CT A/P ?- Continue home tylenol, voltaren, tizanidine, and oxycodone. We discussed flexeril today, but he has not tolerated this in the past. I'll call him after stat labs result this afternoon to figure out next steps for pain, but not adding any medicines this morning  ?

## 2021-09-13 NOTE — Telephone Encounter (Signed)
Called patient to review labs. CBC and CMP are unremarkable.  ?I really think this is a muscle strain. ?It feels similar to 06/11/21 muscle strain, when Toradol shot in the ER was helpful. ? ?I recommended that patient take Naproxen 525m BID for anti-inflammatory effect. He already has this medicine at home, and it hasn't been very helpful previously. He will try it though. ? ?I also recommend increasing his Tizanidine from once daily to 4x daily for the short-term, and I'll send in a new script for this. ? ?I recommended PT, but understandably this doesn't work with patient's work schedule, so he politely declined referral today.  ? ?

## 2021-09-13 NOTE — Addendum Note (Signed)
Addended by: Truddie Crumble on: 09/13/2021 11:17 AM ? ? Modules accepted: Orders ? ?

## 2021-10-08 ENCOUNTER — Ambulatory Visit (HOSPITAL_COMMUNITY)
Admission: EM | Admit: 2021-10-08 | Discharge: 2021-10-08 | Disposition: A | Discharge status: Home / Self Care | Payer: 59 | Attending: Family Medicine | Admitting: Family Medicine

## 2021-10-08 ENCOUNTER — Ambulatory Visit (INDEPENDENT_AMBULATORY_CARE_PROVIDER_SITE_OTHER): Payer: 59

## 2021-10-08 ENCOUNTER — Encounter (HOSPITAL_COMMUNITY): Payer: Self-pay | Admitting: Emergency Medicine

## 2021-10-08 DIAGNOSIS — M5441 Lumbago with sciatica, right side: Secondary | ICD-10-CM | POA: Diagnosis not present

## 2021-10-08 DIAGNOSIS — M545 Low back pain, unspecified: Secondary | ICD-10-CM

## 2021-10-08 DIAGNOSIS — G8929 Other chronic pain: Secondary | ICD-10-CM | POA: Diagnosis not present

## 2021-10-08 DIAGNOSIS — M47816 Spondylosis without myelopathy or radiculopathy, lumbar region: Secondary | ICD-10-CM | POA: Diagnosis not present

## 2021-10-08 MED ORDER — KETOROLAC TROMETHAMINE 30 MG/ML IJ SOLN
30.0000 mg | Freq: Once | INTRAMUSCULAR | Status: AC
  Administered 2021-10-08: 30 mg via INTRAMUSCULAR

## 2021-10-08 MED ORDER — KETOROLAC TROMETHAMINE 30 MG/ML IJ SOLN
INTRAMUSCULAR | Status: AC
  Filled 2021-10-08: qty 1

## 2021-10-08 NOTE — ED Triage Notes (Signed)
Patient c/o low back sprain for several weeks, no apparent injury.  Pain is recurrent.  Patient has been taken Tylenol and muscle rub cream.

## 2021-10-08 NOTE — Discharge Instructions (Addendum)
Your x-rays did not show any broken bones  You have been given a shot of Toradol 30 mg today.   Continue your pain medications and muscle relaxers as prescribed.

## 2021-10-08 NOTE — ED Provider Notes (Signed)
[facility]    CSN: [id number] Arrival date & time: 10/08/21  1412      History   Chief Complaint Chief Complaint  Patient presents with   Back Pain    HPI Russell Green is a 51 y.o. male.    Back Pain Here for right low back pain that has returned again in the last week or 2.  He was seen for similar symptoms in February of this year.  He states that at that point he had twisted his back working with one of his dogs.  It improved and then came back.  He is already undergoing pain management care for his low back, and takes Percocet 4 times daily  Past Medical History:  Diagnosis Date   Asthma    childhood issue   Atrial fibrillation, transient ([place])    Myasthenia gravis ([place]) .   Thyroid disease     Patient Active Problem List   Diagnosis Date Noted   Right lower quadrant pain 09/13/2021   Prediabetes 02/09/2021   Chronic midline low back pain with bilateral sciatica 06/29/2020   Primary osteoarthritis of left knee 06/29/2020   Pain medication agreement signed 12/21/2019   Healthcare maintenance 12/17/2011   Myasthenia gravis without exacerbation ([place]) 02/19/2011    Past Surgical History:  Procedure Laterality Date   ABLATION     TRACHEAL SURGERY     WISDOM TOOTH EXTRACTION         Home Medications    Prior to Admission medications   Medication Sig Start Date End Date Taking? Authorizing Provider  acetaminophen (TYLENOL) 325 MG tablet Take 650 mg by mouth every 6 (six) hours as needed for mild pain, fever or headache.   Yes [provider]  diclofenac (VOLTAREN) 75 MG EC tablet SMARTSIG:1 Tablet(s) By Mouth Every 12 Hours 06/11/21  Yes [provider]  omeprazole (PRILOSEC) 20 MG capsule Take 20 mg by mouth daily.   Yes [provider]  Oxycodone HCl 10 MG TABS Take 1 tablet (10 mg total) by mouth 2 (two) times daily. 02/13/21  Yes Janith Lima, MD  oxyCODONE-acetaminophen (PERCOCET) 10-325 MG tablet Take 1 tablet  by mouth every 6 (six) hours. 09/07/21  Yes [provider]  pyridostigmine (MESTINON) 60 MG tablet Take 0.5 tablets (30 mg total) by mouth 3 (three) times daily. 08/09/21  Yes Lottie Mussel, MD  tiZANidine (ZANAFLEX) 4 MG tablet Take 1 tablet (4 mg total) by mouth every 6 (six) hours as needed for muscle spasms. 09/13/21 10/13/21 Yes Lottie Mussel, MD    Family History Family History  Problem Relation Age of Onset   Cancer Mother        unsure of type   Other Father        unsure of medical history   Healthy Sister    Healthy Brother     Social History Social History   Tobacco Use   Smoking status: Former    Packs/day: 0.25    Years: 10.00    Pack years: 2.50    Types: Cigarettes    Quit date: 1995    Years since quitting: 28.4   Smokeless tobacco: Never  Vaping Use   Vaping Use: Never used  Substance Use Topics   Alcohol use: No   Drug use: No     Allergies   Patient has no known allergies.   Review of Systems Review of Systems  Musculoskeletal:  Positive for back pain.    Physical  Exam Triage Vital Signs ED Triage Vitals  Enc Vitals Group     BP 10/08/21 1443 106/64     Pulse Rate 10/08/21 1443 69     Resp 10/08/21 1443 18     Temp 10/08/21 1443 98.4 F (36.9 C)     Temp Source 10/08/21 1443 Oral     SpO2 10/08/21 1443 99 %     Weight 10/08/21 1445 255 lb (115.7 kg)     Height 10/08/21 1445 6' 1"  (1.854 m)     Head Circumference --      Peak Flow --      Pain Score 10/08/21 1445 8     Pain Loc --      Pain Edu? --      Excl. in [place]? --    No data found.  Updated Vital Signs BP 106/64 (BP Location: Right Arm)   Pulse 69   Temp 98.4 F (36.9 C) (Oral)   Resp 18   Ht 6' 1"  (1.854 m)   Wt 115.7 kg   SpO2 99%   BMI 33.64 kg/m   Visual Acuity Right Eye Distance:   Left Eye Distance:   Bilateral Distance:    Right Eye Near:   Left Eye Near:    Bilateral Near:     Physical Exam Vitals reviewed.  Constitutional:      General: He is  not in acute distress.    Appearance: He is not toxic-appearing.  Cardiovascular:     Rate and Rhythm: Normal rate and regular rhythm.  Pulmonary:     Breath sounds: Normal breath sounds.  Musculoskeletal:        General: Tenderness (right LS area) present.     Comments: SLR causes increase in back pain  Neurological:     Mental Status: He is alert and oriented to person, place, and time.  Psychiatric:        Behavior: Behavior normal.     UC Treatments / Results  Labs (all labs ordered are listed, but only abnormal results are displayed) Labs Reviewed - No data to display  EKG   Radiology DG Lumbar Spine 2-3 Views  Result Date: 10/08/2021 CLINICAL DATA:  Low back pain after twisting injury. EXAM: LUMBAR SPINE - 2-3 VIEW COMPARISON:  11/07/2016 FINDINGS: Five lumbar type vertebral bodies. Sacroiliac joints are symmetric. Maintenance of vertebral body height. There may be trace L4-5 anterolisthesis. Loss of intervertebral disc height at the lumbosacral junction with mild facet arthropathy at L4-5. IMPRESSION: Lower lumbar spondylosis, without acute osseous finding. Electronically Signed   By: Abigail Miyamoto M.D.   On: 10/08/2021 15:15    Procedures Procedures (including critical care time)  Medications Ordered in UC Medications  ketorolac (TORADOL) 30 MG/ML injection 30 mg (has no administration in time range)    Initial Impression / Assessment and Plan / UC Course  I have reviewed the triage vital signs and the nursing notes.  Pertinent labs & imaging results that were available during my care of the patient were reviewed by me and considered in my medical decision making (see chart for details).     X-rays do show some mild degenerative changes, but no fractures.  He is agreeable to taking a Toradol shot today.  He already has diclofenac, tizanidine, and Percocet at home.  I did ask him to let his pain management physicians know he was having more trouble on the right  side Final Clinical Impressions(s) / UC Diagnoses   Final diagnoses:  Chronic right-sided low back pain with right-sided sciatica     Discharge Instructions      Your x-rays did not show any broken bones  You have been given a shot of Toradol 30 mg today.   Continue your pain medications and muscle relaxers as prescribed.     ED Prescriptions   None    I have reviewed the PDMP during this encounter.   Barrett Henle, MD 10/08/21 [phone]

## 2021-11-17 ENCOUNTER — Telehealth: Payer: Self-pay

## 2021-11-17 MED ORDER — NAPROXEN 500 MG PO TABS
500.0000 mg | ORAL_TABLET | Freq: Two times a day (BID) | ORAL | 0 refills | Status: AC
Start: 2021-11-17 — End: 2021-12-01

## 2021-11-17 NOTE — Telephone Encounter (Signed)
Requesting refill on naproxen @ [id number]- [place], [place] - [address] [place]

## 2021-11-22 ENCOUNTER — Ambulatory Visit (INDEPENDENT_AMBULATORY_CARE_PROVIDER_SITE_OTHER): Payer: 59

## 2021-11-22 ENCOUNTER — Ambulatory Visit (INDEPENDENT_AMBULATORY_CARE_PROVIDER_SITE_OTHER): Payer: 59 | Admitting: Orthopaedic Surgery

## 2021-11-22 DIAGNOSIS — M25551 Pain in right hip: Secondary | ICD-10-CM

## 2021-11-22 DIAGNOSIS — M1711 Unilateral primary osteoarthritis, right knee: Secondary | ICD-10-CM

## 2021-11-22 MED ORDER — LIDOCAINE HCL 1 % IJ SOLN
0.5000 mL | INTRAMUSCULAR | Status: AC | PRN
  Administered 2021-11-22: .5 mL

## 2021-11-22 MED ORDER — PREDNISONE 10 MG (21) PO TBPK: ORAL_TABLET | ORAL | 0 refills | Status: DC

## 2021-11-22 MED ORDER — METHYLPREDNISOLONE ACETATE 40 MG/ML IJ SUSP
40.0000 mg | INTRAMUSCULAR | Status: AC | PRN
  Administered 2021-11-22: 40 mg via INTRA_ARTICULAR

## 2021-11-22 MED ORDER — BUPIVACAINE HCL 0.25 % IJ SOLN
4.0000 mL | INTRAMUSCULAR | Status: AC | PRN
  Administered 2021-11-22: 4 mL via INTRA_ARTICULAR

## 2021-11-22 NOTE — Progress Notes (Addendum)
Office Visit Note   Patient: Russell Green           Date of Birth: 06-28-1970           MRN: [id number] Visit Date: 11/22/2021              Requested by: Lottie Mussel, MD [address], [place],  [place] [address] PCP: Lottie Mussel, MD   Assessment & Plan: Visit Diagnoses:  1. Unilateral primary osteoarthritis, right knee   2. Pain of right hip     Plan: Right knee injection performed which he tolerated well.  Improvement in his knee pain postinjection.  We will send in prednisone Dosepak that he can take in addition to his baseline prednisone which help with his possible disc protrusion symptoms.  If his radicular symptoms do not improve then we can proceed with lumbar MRI imaging.  Follow-Up Instructions: No follow-ups on file.   Orders:  Orders Placed This Encounter  Procedures   Large Joint Inj: R knee   XR HIP UNILAT W OR W/O PELVIS 2-3 VIEWS RIGHT   XR KNEE 3 VIEW RIGHT   Meds ordered this encounter  Medications   predniSONE (STERAPRED UNI-PAK 21 TAB) 10 MG (21) TBPK tablet    Sig: Take 6,5,4,3,2,1 with meals then resume normal previous prednisone dose.    Dispense:  21 tablet    Refill:  0      Procedures: Large Joint Inj: R knee on 11/22/2021 9:09 AM Indications: pain and joint swelling Details: 22 G 1.5 in needle, anterolateral approach  Arthrogram: No  Medications: 40 mg methylPREDNISolone acetate 40 MG/ML; 0.5 mL lidocaine 1 %; 4 mL bupivacaine 0.25 % Outcome: tolerated well, no immediate complications Procedure, treatment alternatives, risks and benefits explained, specific risks discussed. Consent was given by the patient. Immediately prior to procedure a time out was called to verify the correct patient, procedure, equipment, support staff and site/side marked as required. Patient was prepped and draped in the usual sterile fashion.       Clinical Data: No additional findings.   Subjective: Chief Complaint  Patient presents with    Lower Back - Pain   Right Hip - Pain   Right Knee - Pain    HPI 51 year old male with diagnosis of myasthenia gravis returns has had increased problems with his right knee is requesting cortisone injection he is already on 10 or 15 mg of prednisone chronically for his myasthenia.  He has had back pain that radiates from his back and his right hip onset 2 to 3 months ago.  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  He has been on muscle relaxants also oxycodone for pain.  Right knee pain has been aching previous injection by me in March gave him good knee pain relief he is requesting repeat injection.  Patient states he may have twisted his back with working with one of his dogs.  He was in the ED 10/08/2021 for this.  Similar type symptoms back in February but not as severe.  Review of Systems all systems noncontributory to HPI.   Objective: Vital Signs: There were no vitals taken for this visit.  Physical Exam Constitutional:      Appearance: He is well-developed.  HENT:     Head: Normocephalic and atraumatic.     Right Ear: External ear normal.     Left Ear: External ear normal.  Eyes:     Pupils: Pupils  are equal, round, and reactive to light.  Neck:     Thyroid: No thyromegaly.     Trachea: No tracheal deviation.  Cardiovascular:     Rate and Rhythm: Normal rate.  Pulmonary:     Effort: Pulmonary effort is normal.     Breath sounds: No wheezing.  Abdominal:     General: Bowel sounds are normal.     Palpations: Abdomen is soft.  Musculoskeletal:     Cervical back: Neck supple.  Skin:    General: Skin is warm and dry.     Capillary Refill: Capillary refill takes less than 2 seconds.  Neurological:     Mental Status: He is alert and oriented to person, place, and time.  Psychiatric:        Behavior: Behavior normal.        Thought Content: Thought content normal.        Judgment: Judgment normal.     Ortho Exam patient has mild  effusion right knee with tenderness medial lateral crepitus with knee range of motion.  Mild sciatic notch tenderness.  Anterior tib EHL is intact.  Specialty Comments:  No specialty comments available.  Imaging: XR KNEE 3 VIEW RIGHT  Result Date: 11/22/2021 Standing AP both knees lateral right knee sunrise patella x-ray obtained and reviewed.  This showed mild joint space narrowing small marginal osteophytes consistent with some knee osteoarthritis.  No acute bony changes appreciated. Impression: Mild knee osteoarthritis.  XR HIP UNILAT W OR W/O PELVIS 2-3 VIEWS RIGHT  Result Date: 11/22/2021 AP pelvis showing both hips and frog-leg right hip obtained and reviewed.  This shows slight joint space narrowing right left hip.  No marginal osteophytes.  No soft tissue calcification. Impression: Right hip negative for significant hip osteoarthritis.    PMFS History: Patient Active Problem List   Diagnosis Date Noted   Right lower quadrant pain 09/13/2021   Prediabetes 02/09/2021   Chronic midline low back pain with bilateral sciatica 06/29/2020   Primary osteoarthritis of left knee 06/29/2020   Pain medication agreement signed 12/21/2019   Healthcare maintenance 12/17/2011   Myasthenia gravis without exacerbation ([place]) 02/19/2011   Past Medical History:  Diagnosis Date   Asthma    childhood issue   Atrial fibrillation, transient ([place])    Myasthenia gravis ([place]) .   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

## 2021-11-27 ENCOUNTER — Telehealth: Payer: Self-pay | Admitting: Orthopaedic Surgery

## 2021-11-27 DIAGNOSIS — G8929 Other chronic pain: Secondary | ICD-10-CM

## 2021-11-27 NOTE — Telephone Encounter (Signed)
Pt called requesting a referral for MRI be sent. Pt states injection did not work and want to proceed with MRI. Pt phone number is [phone].

## 2021-11-28 NOTE — Telephone Encounter (Signed)
Please advise 

## 2021-11-28 NOTE — Telephone Encounter (Signed)
Mri ordered. I called and advised pt

## 2021-12-11 ENCOUNTER — Ambulatory Visit
Admission: RE | Admit: 2021-12-11 | Discharge: 2021-12-11 | Disposition: A | Discharge status: Home / Self Care | Payer: 59 | Source: Ambulatory Visit | Attending: Orthopaedic Surgery | Admitting: Orthopaedic Surgery

## 2021-12-11 DIAGNOSIS — M25551 Pain in right hip: Secondary | ICD-10-CM | POA: Diagnosis not present

## 2021-12-11 DIAGNOSIS — G8929 Other chronic pain: Secondary | ICD-10-CM

## 2021-12-11 DIAGNOSIS — M545 Low back pain, unspecified: Secondary | ICD-10-CM | POA: Diagnosis not present

## 2021-12-12 ENCOUNTER — Ambulatory Visit (INDEPENDENT_AMBULATORY_CARE_PROVIDER_SITE_OTHER): Payer: 59 | Admitting: Orthopaedic Surgery

## 2021-12-12 DIAGNOSIS — M48062 Spinal stenosis, lumbar region with neurogenic claudication: Secondary | ICD-10-CM

## 2021-12-12 DIAGNOSIS — G7000 Myasthenia gravis without (acute) exacerbation: Secondary | ICD-10-CM

## 2021-12-13 DIAGNOSIS — M48061 Spinal stenosis, lumbar region without neurogenic claudication: Secondary | ICD-10-CM | POA: Insufficient documentation

## 2021-12-13 NOTE — Progress Notes (Signed)
Office Visit Note   Patient: Russell Green           Date of Birth: 11-Sep-1970           MRN: [id number] Visit Date: 12/12/2021              Requested by: Lottie Mussel, MD [address], [place],  [place] [address] PCP: Lottie Mussel, MD   Assessment & Plan: Visit Diagnoses: No diagnosis found.  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  We discussed potential adherence of the cyst of the dura potential for dural tear with excision of the cyst.  He understands that there could be recurrence of the cyst which would require repeat surgery and he could need a fusion at that level.  Plan to be laminectomy decompression removal extradural facet cyst at the L4-5 level.  Risks of surgery discussed questions elicited and answered he understands request to proceed.  Follow-Up Instructions: No follow-ups on file.   Orders:  No orders of the defined types were placed in this encounter.  No orders of the defined types were placed in this encounter.     Procedures: No procedures performed   Clinical Data: No additional findings.   Subjective: Chief Complaint  Patient presents with   Lower Back - Follow-up    HPI 51 year old male with myasthenia gravis returns with ongoing problems with severe low back pain and right leg numbness and weakness.  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  He is taking a muscle axis oxycodone.  He had an injection in his knee.  He has numbness anteriorly in his thigh weakness in the right leg none on the left.  He states he is having difficulty sleeping the pain is progressed over the last few months.  He thinks he originally twisted his back while he is working with one of his dogs.  He has been in the emergency department last on 6//23.  MRI scan has been available and shows a very large cyst causing severe spinal  stenosis at L4-5 compressing the thecal sac and right L5 nerve root taking out more than half the canal.  No other significant changes in the lumbar spine.  Cyst measures 15 x 20 mm.  Pain radiates down the dorsum of his right foot.  He has noticed he cannot stand long has to stop and sit.  He is notices only been in the right leg but minimally on the left.  Review of Systems history of prediabetes.  Myasthenia gravis.  Patient's community ambulator.  Current severe back and right leg pain.   Objective: Vital Signs: There were no vitals taken for this visit.  Physical Exam Constitutional:      Appearance: He is well-developed.  HENT:     Head: Normocephalic and atraumatic.     Right Ear: External ear normal.     Left Ear: External ear normal.  Eyes:     Pupils: Pupils are equal, round, and reactive to light.  Neck:     Thyroid: No thyromegaly.     Trachea: No tracheal deviation.  Cardiovascular:     Rate and Rhythm: Normal rate.  Pulmonary:     Effort: Pulmonary effort is normal.     Breath sounds: No wheezing.  Abdominal:     General: Bowel sounds are normal.     Palpations: Abdomen is  soft.  Musculoskeletal:     Cervical back: Neck supple.  Skin:    General: Skin is warm and dry.     Capillary Refill: Capillary refill takes less than 2 seconds.  Neurological:     Mental Status: He is alert and oriented to person, place, and time.  Psychiatric:        Behavior: Behavior normal.        Thought Content: Thought content normal.        Judgment: Judgment normal.     Ortho Exam no pain with logroll the hips.  He does have pain with straight leg raising on the right negative on the left.  Straight leg raise +70 degrees on the right.  Slight anterior tib EHL weakness on the right normal on the left.  Positive sciatic notch tenderness on the right negative on the left.  Specialty Comments:  No specialty comments available.  Imaging:   Final [99]   PACS Intelerad Image  Link   Show images for MR Lumbar Spine w/o contrast Study Result  Narrative & Impression  CLINICAL DATA:  Low back pain with right hip pain   EXAM: MRI LUMBAR SPINE WITHOUT CONTRAST   TECHNIQUE: Multiplanar, multisequence MR imaging of the lumbar spine was performed. No intravenous contrast was administered.   COMPARISON:  Lumbar radiographs 05/10/2021   FINDINGS: Segmentation:  5 lumbar vertebra.  Lowest disc space L5-S1   Alignment:  Mild anterolisthesis L4-5   Vertebrae:  Negative for fracture or mass.   Conus medullaris and cauda equina: Conus extends to the L2 level. Conus and cauda equina appear normal.   Paraspinal and other soft tissues: Negative for paraspinous mass, adenopathy, or fluid collection   Disc levels:   L1-2: Negative   L2-3: Bilateral facet degeneration. Negative for disc protrusion or stenosis   L3-4: Mild disc and mild facet degeneration. Negative for disc protrusion or stenosis   L4-5: Mild anterolisthesis. Disc degeneration and disc space narrowing. Moderate to advanced facet degeneration bilaterally. 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 This is compressing the thecal sac and also compressing the right L5 nerve root. Moderate foraminal encroachment bilaterally due to bony spurring.   L5-S1: Mild disc and facet degeneration.  Negative for stenosis.   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 In addition there is moderate foraminal encroachment bilaterally due to spurring at L4-5.     Electronically Signed   By: Franchot Gallo M.D.   On: 12/12/2021 11:13       PMFS History: Patient Active Problem List   Diagnosis Date Noted   Right lower quadrant pain 09/13/2021   Prediabetes 02/09/2021   Chronic midline low back pain with bilateral  sciatica 06/29/2020   Primary osteoarthritis of left knee 06/29/2020   Pain medication agreement signed 12/21/2019   Healthcare maintenance 12/17/2011   Myasthenia gravis without exacerbation ([place]) 02/19/2011   Past Medical History:  Diagnosis Date   Asthma    childhood issue   Atrial fibrillation, transient ([place])    Myasthenia gravis ([place]) .   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

## 2021-12-26 ENCOUNTER — Telehealth: Payer: Self-pay

## 2021-12-26 ENCOUNTER — Other Ambulatory Visit: Payer: Self-pay

## 2021-12-26 MED ORDER — NAPROXEN 500 MG PO TABS: 500.0000 mg | ORAL_TABLET | Freq: Two times a day (BID) | ORAL | 1 refills | Status: DC

## 2021-12-26 NOTE — Telephone Encounter (Signed)
Naproxen  [id number] [place], [place] - [id number]

## 2022-01-02 NOTE — Progress Notes (Signed)
Confirmed with Karoline Caldwell, PA-C that it is okay to continue mestinon through day of surgery. Will send for anesthesia review.

## 2022-01-02 NOTE — Progress Notes (Signed)
Surgical Instructions    Your procedure is scheduled on Monday, 01/15/2022.  Report to [facility] Main Entrance "A" at 10:15 A.M., then check in with the Admitting office.  Call this number if you have problems the morning of surgery:  [phone]   If you have any questions prior to your surgery date call [phone]: Open Monday-Friday 8am-4pm    Remember:  Do not eat after midnight the night before your surgery  You may drink clear liquids until 9:15am the morning of your surgery.   Clear liquids allowed are: Water, Non-Citrus Juices (without pulp), Carbonated Beverages, Clear Tea, Black Coffee ONLY (NO MILK, CREAM OR POWDERED CREAMER of any kind), and Gatorade    Take these medicines the morning of surgery with A SIP OF WATER:  omeprazole (PRILOSEC) predniSONE (DELTASONE) pyridostigmine (MESTINON)  IF NEEDED; oxyCODONE-acetaminophen (PERCOCET)  As of today, STOP taking any Aspirin (unless otherwise instructed by your surgeon) Aleve, Naproxen, Ibuprofen, Motrin, Advil, Goody's, BC's, all herbal medications, fish oil, diclofenac Sodium (VOLTAREN) and all vitamins.           Do not wear jewelry or makeup. Do not wear lotions, powders, perfumes/cologne or deodorant. Men may shave face and neck. Do not bring valuables to the hospital. Do not wear nail polish, gel polish, artificial nails, or any other type of covering on natural nails (fingers and toes) If you have artificial nails or gel coating that need to be removed by a nail salon, please have this removed prior to surgery. Artificial nails or gel coating may interfere with anesthesia's ability to adequately monitor your vital signs.  [place] is not responsible for any belongings or valuables.    Do NOT Smoke (Tobacco/Vaping)  24 hours prior to your procedure  If you use a CPAP at night, you may bring your mask for your overnight stay.   Contacts, glasses, hearing aids, dentures or partials may not be worn into  surgery, please bring cases for these belongings   For patients admitted to the hospital, discharge time will be determined by your treatment team.   Patients discharged the day of surgery will not be allowed to drive home, and someone needs to stay with them for 24 hours.   SURGICAL WAITING ROOM VISITATION Patients having surgery or a procedure may have no more than 2 support people in the waiting area - these visitors may rotate.   Children under the age of 9 must have an adult with them who is not the patient. If the patient needs to stay at the hospital during part of their recovery, the visitor guidelines for inpatient rooms apply. Pre-op nurse will coordinate an appropriate time for 1 support person to accompany patient in pre-op.  This support person may not rotate.   Please refer to the [facility] website for the visitor guidelines for Inpatients (after your surgery is over and you are in a regular room).    Special instructions:    Oral Hygiene is also important to reduce your risk of infection.  Remember - BRUSH YOUR TEETH THE MORNING OF SURGERY WITH YOUR REGULAR TOOTHPASTE   [place]- Preparing For Surgery  Before surgery, you can play an important role. Because skin is not sterile, your skin needs to be as free of germs as possible. You can reduce the number of germs on your skin by washing with CHG (chlorahexidine gluconate) Soap before surgery.  CHG is an antiseptic cleaner which kills germs and bonds with the skin to continue killing  germs even after washing.     Please do not use if you have an allergy to CHG or antibacterial soaps. If your skin becomes reddened/irritated stop using the CHG.  Do not shave (including legs and underarms) for at least 48 hours prior to first CHG shower. It is OK to shave your face.  Please follow these instructions carefully.     Shower the NIGHT BEFORE SURGERY and the MORNING OF SURGERY with CHG Soap.   If you chose to wash your  hair, wash your hair first as usual with your normal shampoo. After you shampoo, rinse your hair and body thoroughly to remove the shampoo.  Then [employer] and genitals (private parts) with your normal soap and rinse thoroughly to remove soap.  After that Use CHG Soap as you would any other liquid soap. You can apply CHG directly to the skin and wash gently with a scrungie or a clean washcloth.   Apply the CHG Soap to your body ONLY FROM THE NECK DOWN.  Do not use on open wounds or open sores. Avoid contact with your eyes, ears, mouth and genitals (private parts). Wash Face and genitals (private parts)  with your normal soap.   Wash thoroughly, paying special attention to the area where your surgery will be performed.  Thoroughly rinse your body with warm water from the neck down.  DO NOT shower/wash with your normal soap after using and rinsing off the CHG Soap.  Pat yourself dry with a CLEAN TOWEL.  Wear CLEAN PAJAMAS to bed the night before surgery  Place CLEAN SHEETS on your bed the night before your surgery  DO NOT SLEEP WITH PETS.   Day of Surgery: Take a shower with CHG soap. Wear Clean/Comfortable clothing the morning of surgery Do not apply any deodorants/lotions.   Remember to brush your teeth WITH YOUR REGULAR TOOTHPASTE.    If you received a COVID test during your pre-op visit, it is requested that you wear a mask when out in public, stay away from anyone that may not be feeling well, and notify your surgeon if you develop symptoms. If you have been in contact with anyone that has tested positive in the last 10 days, please notify your surgeon.    Please read over the following fact sheets that you were given.

## 2022-01-03 ENCOUNTER — Encounter (HOSPITAL_COMMUNITY): Payer: Self-pay

## 2022-01-03 ENCOUNTER — Other Ambulatory Visit: Payer: Self-pay

## 2022-01-03 ENCOUNTER — Encounter (HOSPITAL_COMMUNITY)
Admission: RE | Admit: 2022-01-03 | Discharge: 2022-01-03 | Disposition: A | Discharge status: Home / Self Care | Payer: 59 | Source: Ambulatory Visit | Attending: Orthopaedic Surgery | Admitting: Orthopaedic Surgery

## 2022-01-03 VITALS — BP 126/88 | HR 76 | Temp 99.1°F | Resp 17 | Ht 73.0 in | Wt 244.5 lb

## 2022-01-03 DIAGNOSIS — I2510 Atherosclerotic heart disease of native coronary artery without angina pectoris: Secondary | ICD-10-CM

## 2022-01-03 DIAGNOSIS — Z01818 Encounter for other preprocedural examination: Secondary | ICD-10-CM

## 2022-01-03 DIAGNOSIS — R7303 Prediabetes: Secondary | ICD-10-CM | POA: Diagnosis not present

## 2022-01-03 HISTORY — DX: Personal history of other specified conditions: Z87.898

## 2022-01-03 HISTORY — DX: Cardiac arrhythmia, unspecified: I49.9

## 2022-01-03 HISTORY — DX: Unspecified osteoarthritis, unspecified site: M19.90

## 2022-01-03 LAB — CBC
HCT: 45.9 % (ref 39.0–52.0)
Hemoglobin: 15.4 g/dL (ref 13.0–17.0)
MCH: 30.6 pg (ref 26.0–34.0)
MCHC: 33.6 g/dL (ref 30.0–36.0)
MCV: 91.3 fL (ref 80.0–100.0)
Platelets: 196 10*3/uL (ref 150–400)
RBC: 5.03 MIL/uL (ref 4.22–5.81)
RDW: 13.5 % (ref 11.5–15.5)
WBC: 8.8 10*3/uL (ref 4.0–10.5)
nRBC: 0 % (ref 0.0–0.2)

## 2022-01-03 LAB — BASIC METABOLIC PANEL
Anion gap: 7 (ref 5–15)
BUN: 16 mg/dL (ref 6–20)
CO2: 32 mmol/L (ref 22–32)
Calcium: 9.2 mg/dL (ref 8.9–10.3)
Chloride: 104 mmol/L (ref 98–111)
Creatinine, Ser: 1.48 mg/dL — ABNORMAL HIGH (ref 0.61–1.24)
GFR, Estimated: 57 mL/min — ABNORMAL LOW (ref >60)
Glucose, Bld: 91 mg/dL (ref 70–99)
Potassium: 4.1 mmol/L (ref 3.5–5.1)
Sodium: 143 mmol/L (ref 135–145)

## 2022-01-03 LAB — HEMOGLOBIN A1C
Hgb A1c MFr Bld: 6.7 % — ABNORMAL HIGH (ref 4.8–5.6)
Mean Plasma Glucose: 145.59 mg/dL

## 2022-01-03 LAB — SURGICAL PCR SCREEN
MRSA, PCR: NEGATIVE
Staphylococcus aureus: NEGATIVE

## 2022-01-03 NOTE — Progress Notes (Addendum)
For Short Stay: [place] appointment date: Date of COVID positive in last 3 days:  Bowel Prep reminder:   For Anesthesia: PCP - Dr. Lottie Mussel Cardiologist - DO: Rex Kras. LOV: 07/05/20  Chest x-ray -  EKG -  Stress Test -  ECHO -  Cardiac Cath -  Pacemaker/ICD device last checked: Pacemaker orders received: Device Rep notified:  Spinal Cord Stimulator:  Sleep Study -  CPAP -   Fasting Blood Sugar - N/A Checks Blood Sugar __0___ times a day Date and result of last Hgb A1c-  Blood Thinner Instructions: Aspirin Instructions: Last Dose:  Activity level: Can go up a flight of stairs and activities of daily living without stopping and without chest pain and/or shortness of breath   Able to exercise without chest pain and/or shortness of breath   Unable to go up a flight of stairs without chest pain and/or shortness of breath     Anesthesia review: Hx: Afib,Myasthenia Gravis.Pt. takes Mestinon.,Pre-DIA.  Patient denies shortness of breath, fever, cough and chest pain at PAT appointment   Patient verbalized understanding of instructions that were given to them at the PAT appointment. Patient was also instructed that they will need to review over the PAT instructions again at home before surgery.

## 2022-01-04 ENCOUNTER — Telehealth: Payer: Self-pay | Admitting: Orthopaedic Surgery

## 2022-01-04 ENCOUNTER — Encounter (HOSPITAL_COMMUNITY): Payer: Self-pay

## 2022-01-04 NOTE — Progress Notes (Signed)
Anesthesia Chart Review:  Case: [id number] Date/Time: 01/15/22 1203   Procedure: L4-5 LUMBAR DECOMPRESSION, REMOVAL OF L4-5 FACET CYST   Anesthesia type: General   Pre-op diagnosis: L4-5 spinal stenosis, facet cyst   Location: [facility] OR ROOM 05 / [place] [facility]   Surgeons: [name], MD       DISCUSSION: Patient is a 51 year old male scheduled for the above procedure.  History includes former smoker (quit 05/07/93), Myasthenia gravis (12/2010), childhood asthma, hyperthyroidism (s/p RAI ~ 10/2010), aflutter (in setting of hyperthyroidism, s/p ablation ~ 10/2010), prediabetes.  Around 12/2010, while incarcerated, he developed acute respiratory failure requiring intubation. He was ultimately diagnosed with myasthenia gravis (MG) with significant bulbar, ocular, limb muscle weakness. He had prolonged VDRF requiring temporary tracheostomy. Per neurology records, symptoms improved with plasma exchange. He declined CellCept due to concern for side effects. He was managed primarily on prednisone and mestinon and had follow-up with [facility] until establishing with Dr. Krista Blue at [facility] in March 2022.   Per his last visit with Dr. Krista Blue on 02/17/21. She discussed transitioning prednisone 30 mg daily to a steroid sparing agent like CellCept or Imuran, but he was worried about side effects. He continued Mestinon 60 mg TID. Sleeps on one pillow without SOB during sleep, but with frequent snoring. Was working as a Financial trader care aid. Occasional weakness, and may have a diplopia or fatigue at the end of the day. She referred him to pulmonology, sleep evaluation which has not occurred yet. Per patient, he is currently on prednisone 20 mg daily and Mestinon 60 mg TID.  Cardiology evaluation by Dr. Terri Skains on 07/05/20 for DOE, some exertional chest achiness and abnormal EKG (possible inferior infarct, T wave inversion inferior leads). He also had occasional sensations of his heart racing, without known  recurrent afib/flutter since his ablation. Symptoms thought likely due to underlying PACs/PVCs, but discussed consideration of a monitor in the future. An echo and coronary CT were ordered. He was started on Lopressor until he had his CCTA (no longer on). 07/14/20 echo showed normal LVF, EF 60-65%, normal wall motion, mild TR. 07/20/20 CCTA showed coronary calcium score of 0 with mild (25-49% mid-distal LCX). Risk factor modification recommended. His 01/03/22 EKG appeared stable.   History of pre-diabetes with A1c 6.4% on 02/20/21, but up to 6.7% on 01/03/22. Glucose 91 with labs. He is not on any diabetes medications. His Creatinine was 1.48, previously in the ~ 1.2-1.35 range. I did call and review these labs, including A1c is currently in the diabetes range. Last month he was on a Sterapred Pack along with his usual daily prednisone and had also been taking Naprosyn more regularly which is now on hold. Discussed how these medications could affect his lab results and advised PCP follow-up. He did confirm that his MG symptoms are currently stable.   Earlier this month, I reviewed case with anesthesiologist Annye Asa, MD given his myasthenia gravis (MG) history. He should continue his MG perioperatively which he is aware of.  Anesthesia team to evaluate on the day of surgery.   VS: BP 126/88   Pulse 76   Temp 37.3 C (Oral)   Resp 17   Ht '6\' 1"'$  (1.854 m)   Wt 110.9 kg   SpO2 100%   BMI 32.26 kg/m    PROVIDERS: Lottie Mussel, MD is PCP, established 08/09/21 after change in insurance. Previously saw Scarlette Calico, MD.  Rex Kras, DO is cardiologist, established 07/05/20. Marcial Pacas, MD  is neurologist, established 07/06/20.    LABS: Labs reviewed: Acceptable for surgery. Cr 1.48, previously 1.21-1.34 when compared to labs in [facility] since 06/29/20. See DISCUSSION. (all labs ordered are listed, but only abnormal results are displayed)  Labs Reviewed  BASIC METABOLIC PANEL - Abnormal; Notable for  the following components:      Result Value   Creatinine, Ser 1.48 (*)    GFR, Estimated 57 (*)    All other components within normal limits  HEMOGLOBIN A1C - Abnormal; Notable for the following components:   Hgb A1c MFr Bld 6.7 (*)    All other components within normal limits  SURGICAL PCR SCREEN  CBC     IMAGES: MRI L-spine 12/11/21: IMPRESSION: The dominant finding is a large synovial cyst on the right at L4-5 projecting into the spinal canal compressing the thecal sac and the right L5 nerve root. In addition there is moderate foraminal encroachment bilaterally due to spurring at L4-5.    EKG: Normal sinus rhythm Possible Left atrial enlargement T wave abnormality, consider inferior ischemia Abnormal ECG No previous ECGs available Confirmed by Gwyndolyn Kaufman [id number]) on 01/03/2022 12:59:29 PM - He has comparison tracings from 06/29/20 and 07/05/20 that also show inferior T wave inversion, consider ischemia.   CV: CT Coronary 07/20/20: IMPRESSION: 1. Total coronary calcium score of 0. 2. Normal coronary origin with right dominance. 3. CAD-RADS = 2. Mild stenosis (25-49%) in the mid to distal LCX due to noncalcified plaque. RECOMMENDATIONS: Consider non-atherosclerotic causes of chest pain. Consider preventive therapy and risk factor modification.   Echocardiogram 07/14/2020:  Normal LV systolic function with visual EF 60-65%. Left ventricle cavity  is normal in size. Normal global wall motion. Normal diastolic filling  pattern, normal LAP.  Mild tricuspid regurgitation.  No prior study for comparison.   Past Medical History:  Diagnosis Date   Arthritis    Asthma    childhood issue   Atrial fibrillation, transient ([place])    aflutter 10/2010   Dysrhythmia    History of prediabetes    Myasthenia gravis ([place]) 12/2010   Thyroid disease    hyperthyroidism, s/p RAI    Past Surgical History:  Procedure Laterality Date   ABLATION     TRACHEAL SURGERY      WISDOM TOOTH EXTRACTION      MEDICATIONS:  CALCIUM-VITAMIN D PO   diclofenac Sodium (VOLTAREN) 1 % GEL   multivitamin-lutein (OCUVITE-LUTEIN) CAPS capsule   naproxen (NAPROSYN) 500 MG tablet   omeprazole (PRILOSEC) 20 MG capsule   Oxycodone HCl 10 MG TABS   oxyCODONE-acetaminophen (PERCOCET) 10-325 MG tablet   predniSONE (DELTASONE) 10 MG tablet   predniSONE (STERAPRED UNI-PAK 21 TAB) 10 MG (21) TBPK tablet   pyridostigmine (MESTINON) 60 MG tablet   No current facility-administered medications for this encounter.    Myra Gianotti, PA-C Surgical Short Stay/Anesthesiology [facility] Phone [phone] [facility] Phone [phone] 01/04/2022 5:44 PM

## 2022-01-04 NOTE — Telephone Encounter (Signed)
Pt called requesting a call back. Pt has upcoming surgery Sept 11 and need to know if he need to stop taking mestinon. Please call pt at [phone]

## 2022-01-04 NOTE — Anesthesia Preprocedure Evaluation (Addendum)
Anesthesia Evaluation  Patient identified by MRN, date of birth, ID band Patient awake    Reviewed: Allergy & Precautions, H&P , NPO status , Patient's Chart, lab work & pertinent test results  Airway Mallampati: II   Neck ROM: full    Dental   Pulmonary asthma , former smoker,    breath sounds clear to auscultation       Cardiovascular + dysrhythmias Atrial Fibrillation  Rhythm:regular Rate:Normal     Neuro/Psych Myasthenia gravis  Neuromuscular disease    GI/Hepatic   Endo/Other    Renal/GU Renal InsufficiencyRenal disease     Musculoskeletal  (+) Arthritis ,   Abdominal   Peds  Hematology   Anesthesia Other Findings   Reproductive/Obstetrics                            Anesthesia Physical Anesthesia Plan  ASA: 3  Anesthesia Plan: General   Post-op Pain Management:    Induction: Intravenous  PONV Risk Score and Plan: 2 and Ondansetron, Dexamethasone, Midazolam and Treatment may vary due to age or medical condition  Airway Management Planned: Oral ETT and Video Laryngoscope Planned  Additional Equipment:   Intra-op Plan:   Post-operative Plan: Extubation in OR  Informed Consent: I have reviewed the patients History and Physical, chart, labs and discussed the procedure including the risks, benefits and alternatives for the proposed anesthesia with the patient or authorized representative who has indicated his/her understanding and acceptance.     Dental advisory given  Plan Discussed with: CRNA, Anesthesiologist and Surgeon  Anesthesia Plan Comments: (See PAT note written 01/04/2022 by Myra Gianotti, PA-C. MG history on prednisone, Mestinon. )       Anesthesia Quick Evaluation

## 2022-01-04 NOTE — Telephone Encounter (Signed)
I called patient to advise per message received earlier from [place].  Had to leave voicemail. Advised I was returning call and would reach out to him again in the morning.  Message from [place]: FYI: I see there is a message from today from the patient asking about Mestinon.  PAT Staff spoke with our APP Jeneen Rinks yesterday who confirmed that Mr. Hieronymus can continue Mestinon even on day of surgery. (This had previously been confirmed with Dr. Glennon Mac when I reviewed with her after Sherrie contacted me last month.) It was also written in his instructions from his appt yesterday.  Maybe he just wanted Dr. Lorin Mercy to confirm??

## 2022-01-05 NOTE — Telephone Encounter (Signed)
I called patient and advised. 

## 2022-01-11 ENCOUNTER — Ambulatory Visit (INDEPENDENT_AMBULATORY_CARE_PROVIDER_SITE_OTHER): Payer: 59 | Admitting: Surgery

## 2022-01-11 ENCOUNTER — Encounter: Payer: Self-pay | Admitting: Surgery

## 2022-01-11 VITALS — BP 128/84 | HR 71 | Ht 73.0 in | Wt 244.5 lb

## 2022-01-11 DIAGNOSIS — M48062 Spinal stenosis, lumbar region with neurogenic claudication: Secondary | ICD-10-CM

## 2022-01-11 DIAGNOSIS — Z01818 Encounter for other preprocedural examination: Secondary | ICD-10-CM

## 2022-01-11 NOTE — Progress Notes (Signed)
Office Visit Note   Patient: Russell Green           Date of Birth: [birth year]           MRN: [id number] Visit Date: 01/11/2022              Requested by: Lottie Mussel, MD [address], [place],  [place] [address] PCP: Lottie Mussel, MD   Assessment & Plan: Visit Diagnoses:  1. Spinal stenosis of lumbar region with neurogenic claudication   2. Preop examination   L4-5 stenosis and facet cyst  Plan: We will proceed with L4-5 decompression and removal of facet cyst as scheduled.*Procedure discussed along potential recovery time.  Advised patient that he can expect to be out of work at least 6 weeks postop and note was given.  Follow-Up Instructions: Return for needs return office visit with Dr Lorin Mercy one week postop.   Orders:  No orders of the defined types were placed in this encounter.  No orders of the defined types were placed in this encounter.     Procedures: No procedures performed   Clinical Data: No additional findings.   Subjective: Chief Complaint  Patient presents with   Lower Back - Follow-up   Pre-op Exam    HPI 51 year old black male with history of L4-5 stenosis, facet cyst, low back pain and right lower extremity radiculopathy comes in for preop evaluation.  States that symptoms unchanged from previous visit.  He is wanting to proceed with L4-5 decompression and removal of facet cyst as scheduled.  Today history physical performed.   Review of Systems No current cardiopulmonary GI/GU issues  Objective: Vital Signs: BP 128/84   Pulse 71   Ht '6\' 1"'$  (1.854 m)   Wt 244 lb 8 oz (110.9 kg)   BMI 32.26 kg/m   Physical Exam HENT:     Head: Normocephalic and atraumatic.     Nose: Nose normal.  Eyes:     Extraocular Movements: Extraocular movements intact.  Cardiovascular:     Rate and Rhythm: Regular rhythm.     Heart sounds: Normal heart sounds.  Pulmonary:     Effort: Pulmonary effort is normal. No respiratory distress.      Breath sounds: Normal breath sounds.  Neurological:     Mental Status: He is alert and oriented to person, place, and time.     Ortho Exam  Specialty Comments:  No specialty comments available.  Imaging: No results found.   PMFS History: Patient Active Problem List   Diagnosis Date Noted   Spinal stenosis of lumbar region 12/13/2021   Right lower quadrant pain 09/13/2021   Prediabetes 02/09/2021   Chronic midline low back pain with bilateral sciatica 06/29/2020   Primary osteoarthritis of left knee 06/29/2020   Pain medication agreement signed 12/21/2019   Healthcare maintenance 12/17/2011   Myasthenia gravis without exacerbation ([place]) 02/19/2011   Past Medical History:  Diagnosis Date   Arthritis    Asthma    childhood issue   Atrial fibrillation, transient ([place])    aflutter 10/2010   Dysrhythmia    History of prediabetes    Myasthenia gravis ([place]) 12/2010   Thyroid disease    hyperthyroidism, s/p RAI    Family History  Problem Relation Age of Onset   Cancer Mother        unsure of type   Other Father        unsure of medical history   Healthy Sister  Healthy Brother     Past Surgical History:  Procedure Laterality Date   A-FLUTTER ABLATION     TRACHEAL SURGERY  2012   WISDOM TOOTH EXTRACTION     Social History   Occupational History   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

## 2022-01-12 NOTE — H&P (Signed)
Office Visit Note              Patient: Russell Green                                            Date of Birth: September 03, 1970                                                    MRN: [id number] Visit Date: 01/11/2022                                                                     Requested by: Lottie Mussel, MD [address] [place],  [place] [address] PCP: Lottie Mussel, MD     Assessment & Plan: Visit Diagnoses:  1. Spinal stenosis of lumbar region with neurogenic claudication   2. Preop examination   L4-5 stenosis and facet cyst   Plan: We will proceed with L4-5 decompression and removal of facet cyst as scheduled.*Procedure discussed along potential recovery time.  Advised patient that he can expect to be out of work at least 6 weeks postop and note was given.   Follow-Up Instructions: Return for needs return office visit with Dr Lorin Mercy one week postop.    Orders:  No orders of the defined types were placed in this encounter.   No orders of the defined types were placed in this encounter.        Procedures: No procedures performed     Clinical Data: No additional findings.     Subjective:    Chief Complaint  Patient presents with   Lower Back - Follow-up   Pre-op Exam      HPI 51 year old black male with history of L4-5 stenosis, facet cyst, low back pain and right lower extremity radiculopathy comes in for preop evaluation.  States that symptoms unchanged from previous visit.  He is wanting to proceed with L4-5 decompression and removal of facet cyst as scheduled.  Today history physical performed.     Review of Systems No current cardiopulmonary GI/GU issues   Objective: Vital Signs: BP 128/84   Pulse 71   Ht '6\' 1"'$  (1.854 m)   Wt 244 lb 8 oz (110.9 kg)   BMI 32.26 kg/m    Physical Exam HENT:     Head: Normocephalic and atraumatic.     Nose: Nose normal.  Eyes:     Extraocular Movements: Extraocular movements intact.  Cardiovascular:      Rate and Rhythm: Regular rhythm.     Heart sounds: Normal heart sounds.  Pulmonary:     Effort: Pulmonary effort is normal. No respiratory distress.     Breath sounds: Normal breath sounds.  Neurological:     Mental Status: He is alert and oriented to person, place, and time.        Ortho Exam   Specialty Comments:  No specialty comments available.   Imaging: No results found.     [place]  History:     Patient Active Problem List    Diagnosis Date Noted   Spinal stenosis of lumbar region 12/13/2021   Right lower quadrant pain 09/13/2021   Prediabetes 02/09/2021   Chronic midline low back pain with bilateral sciatica 06/29/2020   Primary osteoarthritis of left knee 06/29/2020   Pain medication agreement signed 12/21/2019   Healthcare maintenance 12/17/2011   Myasthenia gravis without exacerbation ([place]) 02/19/2011        Past Medical History:  Diagnosis Date   Arthritis     Asthma      childhood issue   Atrial fibrillation, transient ([place])      aflutter 10/2010   Dysrhythmia     History of prediabetes     Myasthenia gravis ([place]) 12/2010   Thyroid disease      hyperthyroidism, s/p RAI         Family History  Problem Relation Age of Onset   Cancer Mother          unsure of type   Other Father          unsure of medical history   Healthy Sister     Healthy Brother           Past Surgical History:  Procedure Laterality Date   A-FLUTTER ABLATION       TRACHEAL SURGERY   2012   WISDOM TOOTH EXTRACTION        Social History         Occupational History   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

## 2022-01-15 ENCOUNTER — Encounter (HOSPITAL_COMMUNITY)
Admission: RE | Disposition: A | Discharge status: Home / Self Care | Payer: Self-pay | Source: Home / Self Care | Attending: Orthopaedic Surgery

## 2022-01-15 ENCOUNTER — Other Ambulatory Visit: Payer: Self-pay

## 2022-01-15 ENCOUNTER — Ambulatory Visit (HOSPITAL_COMMUNITY): Payer: 59

## 2022-01-15 ENCOUNTER — Ambulatory Visit (HOSPITAL_BASED_OUTPATIENT_CLINIC_OR_DEPARTMENT_OTHER): Payer: 59 | Admitting: Certified Registered Nurse Anesthetist

## 2022-01-15 ENCOUNTER — Observation Stay (HOSPITAL_COMMUNITY)
Admission: RE | Admit: 2022-01-15 | Discharge: 2022-01-16 | Disposition: A | Discharge status: Home / Self Care | Payer: 59 | Attending: Orthopaedic Surgery | Admitting: Orthopaedic Surgery

## 2022-01-15 ENCOUNTER — Encounter (HOSPITAL_COMMUNITY): Payer: Self-pay | Admitting: Orthopaedic Surgery

## 2022-01-15 ENCOUNTER — Ambulatory Visit (HOSPITAL_COMMUNITY): Payer: 59 | Admitting: Vascular Surgery

## 2022-01-15 DIAGNOSIS — M48061 Spinal stenosis, lumbar region without neurogenic claudication: Secondary | ICD-10-CM

## 2022-01-15 DIAGNOSIS — M48062 Spinal stenosis, lumbar region with neurogenic claudication: Secondary | ICD-10-CM

## 2022-01-15 DIAGNOSIS — Z01818 Encounter for other preprocedural examination: Secondary | ICD-10-CM

## 2022-01-15 DIAGNOSIS — I4891 Unspecified atrial fibrillation: Secondary | ICD-10-CM | POA: Insufficient documentation

## 2022-01-15 DIAGNOSIS — Z981 Arthrodesis status: Secondary | ICD-10-CM | POA: Diagnosis not present

## 2022-01-15 DIAGNOSIS — M7138 Other bursal cyst, other site: Secondary | ICD-10-CM

## 2022-01-15 DIAGNOSIS — J45909 Unspecified asthma, uncomplicated: Secondary | ICD-10-CM

## 2022-01-15 DIAGNOSIS — Z87891 Personal history of nicotine dependence: Secondary | ICD-10-CM | POA: Insufficient documentation

## 2022-01-15 HISTORY — DX: Prediabetes: R73.03

## 2022-01-15 HISTORY — PX: LUMBAR LAMINECTOMY/DECOMPRESSION MICRODISCECTOMY: SHX5026

## 2022-01-15 LAB — GLUCOSE, CAPILLARY: Glucose-Capillary: 95 mg/dL (ref 70–99)

## 2022-01-15 SURGERY — LUMBAR LAMINECTOMY/DECOMPRESSION MICRODISCECTOMY
Anesthesia: General | Site: Spine Lumbar

## 2022-01-15 MED ORDER — PYRIDOSTIGMINE BROMIDE 60 MG PO TABS
60.0000 mg | ORAL_TABLET | Freq: Two times a day (BID) | ORAL | Status: DC
  Administered 2022-01-15 – 2022-01-16 (×2): 60 mg via ORAL
  Filled 2022-01-15 (×2): qty 1

## 2022-01-15 MED ORDER — POLYETHYLENE GLYCOL 3350 17 G PO PACK: 17.0000 g | PACK | Freq: Every day | ORAL | Status: DC | PRN

## 2022-01-15 MED ORDER — FENTANYL CITRATE (PF) 250 MCG/5ML IJ SOLN
INTRAMUSCULAR | Status: DC | PRN
  Administered 2022-01-15: 50 ug via INTRAVENOUS
  Administered 2022-01-15 (×2): 100 ug via INTRAVENOUS

## 2022-01-15 MED ORDER — MIDAZOLAM HCL 2 MG/2ML IJ SOLN
INTRAMUSCULAR | Status: AC
  Filled 2022-01-15: qty 2

## 2022-01-15 MED ORDER — PHENOL 1.4 % MT LIQD: 1.0000 | OROMUCOSAL | Status: DC | PRN

## 2022-01-15 MED ORDER — METHOCARBAMOL 500 MG PO TABS: 500.0000 mg | ORAL_TABLET | Freq: Four times a day (QID) | ORAL | 0 refills | Status: DC | PRN

## 2022-01-15 MED ORDER — METHOCARBAMOL 1000 MG/10ML IJ SOLN: 500.0000 mg | Freq: Four times a day (QID) | INTRAVENOUS | Status: DC | PRN

## 2022-01-15 MED ORDER — ACETAMINOPHEN 325 MG PO TABS
650.0000 mg | ORAL_TABLET | ORAL | Status: DC | PRN
  Administered 2022-01-15 – 2022-01-16 (×2): 650 mg via ORAL
  Filled 2022-01-15 (×2): qty 2

## 2022-01-15 MED ORDER — SUGAMMADEX SODIUM 200 MG/2ML IV SOLN
INTRAVENOUS | Status: DC | PRN
  Administered 2022-01-15: 200 mg via INTRAVENOUS

## 2022-01-15 MED ORDER — DEXAMETHASONE SODIUM PHOSPHATE 10 MG/ML IJ SOLN
INTRAMUSCULAR | Status: AC
  Filled 2022-01-15: qty 1

## 2022-01-15 MED ORDER — SODIUM CHLORIDE 0.9% FLUSH: 3.0000 mL | INTRAVENOUS | Status: DC | PRN

## 2022-01-15 MED ORDER — ROCURONIUM BROMIDE 10 MG/ML (PF) SYRINGE
PREFILLED_SYRINGE | INTRAVENOUS | Status: DC | PRN
  Administered 2022-01-15 (×2): 20 mg via INTRAVENOUS
  Administered 2022-01-15: 10 mg via INTRAVENOUS
  Administered 2022-01-15: 50 mg via INTRAVENOUS

## 2022-01-15 MED ORDER — PROPOFOL 10 MG/ML IV BOLUS
INTRAVENOUS | Status: AC
  Filled 2022-01-15: qty 20

## 2022-01-15 MED ORDER — ONDANSETRON HCL 4 MG/2ML IJ SOLN
INTRAMUSCULAR | Status: DC | PRN
  Administered 2022-01-15: 4 mg via INTRAVENOUS

## 2022-01-15 MED ORDER — PROSIGHT PO TABS
1.0000 | ORAL_TABLET | Freq: Every day | ORAL | Status: DC
  Administered 2022-01-16: 1 via ORAL
  Filled 2022-01-15: qty 1

## 2022-01-15 MED ORDER — FENTANYL CITRATE (PF) 250 MCG/5ML IJ SOLN
INTRAMUSCULAR | Status: AC
  Filled 2022-01-15: qty 5

## 2022-01-15 MED ORDER — OCUVITE-LUTEIN PO CAPS: 1.0000 | ORAL_CAPSULE | Freq: Every day | ORAL | Status: DC

## 2022-01-15 MED ORDER — CEFAZOLIN SODIUM-DEXTROSE 2-4 GM/100ML-% IV SOLN
INTRAVENOUS | Status: AC
  Filled 2022-01-15: qty 100

## 2022-01-15 MED ORDER — HYDROMORPHONE HCL 1 MG/ML IJ SOLN: 0.5000 mg | INTRAMUSCULAR | Status: DC | PRN

## 2022-01-15 MED ORDER — ROCURONIUM BROMIDE 10 MG/ML (PF) SYRINGE
PREFILLED_SYRINGE | INTRAVENOUS | Status: AC
  Filled 2022-01-15: qty 10

## 2022-01-15 MED ORDER — ORAL CARE MOUTH RINSE: 15.0000 mL | Freq: Once | OROMUCOSAL | Status: AC

## 2022-01-15 MED ORDER — ACETAMINOPHEN 650 MG RE SUPP: 650.0000 mg | RECTAL | Status: DC | PRN

## 2022-01-15 MED ORDER — SURGIFLO WITH THROMBIN (HEMOSTATIC MATRIX KIT) OPTIME
TOPICAL | Status: DC | PRN
  Administered 2022-01-15: 1 via TOPICAL

## 2022-01-15 MED ORDER — OXYCODONE HCL 5 MG PO TABS
5.0000 mg | ORAL_TABLET | Freq: Once | ORAL | Status: AC | PRN
  Administered 2022-01-15: 5 mg via ORAL

## 2022-01-15 MED ORDER — BUPIVACAINE-EPINEPHRINE (PF) 0.25% -1:200000 IJ SOLN
INTRAMUSCULAR | Status: AC
  Filled 2022-01-15: qty 30

## 2022-01-15 MED ORDER — BUPIVACAINE HCL (PF) 0.25 % IJ SOLN
INTRAMUSCULAR | Status: DC | PRN
  Administered 2022-01-15: 10 mL

## 2022-01-15 MED ORDER — DEXMEDETOMIDINE (PRECEDEX) IN NS 20 MCG/5ML (4 MCG/ML) IV SYRINGE
PREFILLED_SYRINGE | INTRAVENOUS | Status: DC | PRN
  Administered 2022-01-15: 8 ug via INTRAVENOUS
  Administered 2022-01-15: 12 ug via INTRAVENOUS

## 2022-01-15 MED ORDER — PROPOFOL 10 MG/ML IV BOLUS
INTRAVENOUS | Status: DC | PRN
  Administered 2022-01-15: 200 mg via INTRAVENOUS

## 2022-01-15 MED ORDER — ONDANSETRON HCL 4 MG/2ML IJ SOLN
INTRAMUSCULAR | Status: AC
  Filled 2022-01-15: qty 2

## 2022-01-15 MED ORDER — ONDANSETRON HCL 4 MG/2ML IJ SOLN: 4.0000 mg | Freq: Four times a day (QID) | INTRAMUSCULAR | Status: DC | PRN

## 2022-01-15 MED ORDER — METHOCARBAMOL 500 MG PO TABS
500.0000 mg | ORAL_TABLET | Freq: Four times a day (QID) | ORAL | Status: DC | PRN
  Administered 2022-01-15 – 2022-01-16 (×3): 500 mg via ORAL
  Filled 2022-01-15 (×3): qty 1

## 2022-01-15 MED ORDER — CEFAZOLIN SODIUM-DEXTROSE 2-4 GM/100ML-% IV SOLN
2.0000 g | INTRAVENOUS | Status: AC
  Administered 2022-01-15: 2 g via INTRAVENOUS

## 2022-01-15 MED ORDER — OXYCODONE HCL 5 MG PO TABS
ORAL_TABLET | ORAL | Status: AC
  Filled 2022-01-15: qty 1

## 2022-01-15 MED ORDER — OXYCODONE-ACETAMINOPHEN 7.5-325 MG PO TABS: 1.0000 | ORAL_TABLET | ORAL | 0 refills | Status: DC | PRN

## 2022-01-15 MED ORDER — LIDOCAINE 2% (20 MG/ML) 5 ML SYRINGE
INTRAMUSCULAR | Status: DC | PRN
  Administered 2022-01-15: 50 mg via INTRAVENOUS

## 2022-01-15 MED ORDER — ONDANSETRON HCL 4 MG PO TABS: 4.0000 mg | ORAL_TABLET | Freq: Four times a day (QID) | ORAL | Status: DC | PRN

## 2022-01-15 MED ORDER — FENTANYL CITRATE (PF) 100 MCG/2ML IJ SOLN
INTRAMUSCULAR | Status: AC
  Filled 2022-01-15: qty 2

## 2022-01-15 MED ORDER — OXYCODONE HCL 5 MG PO TABS
5.0000 mg | ORAL_TABLET | ORAL | Status: DC | PRN
  Administered 2022-01-15 – 2022-01-16 (×5): 10 mg via ORAL
  Filled 2022-01-15 (×5): qty 2

## 2022-01-15 MED ORDER — CHLORHEXIDINE GLUCONATE 0.12 % MT SOLN
OROMUCOSAL | Status: AC
  Administered 2022-01-15: 15 mL via OROMUCOSAL
  Filled 2022-01-15: qty 15

## 2022-01-15 MED ORDER — DEXAMETHASONE SODIUM PHOSPHATE 10 MG/ML IJ SOLN
INTRAMUSCULAR | Status: DC | PRN
  Administered 2022-01-15: 5 mg via INTRAVENOUS

## 2022-01-15 MED ORDER — DOCUSATE SODIUM 100 MG PO CAPS
100.0000 mg | ORAL_CAPSULE | Freq: Two times a day (BID) | ORAL | Status: DC
  Administered 2022-01-15 – 2022-01-16 (×2): 100 mg via ORAL
  Filled 2022-01-15 (×2): qty 1

## 2022-01-15 MED ORDER — MIDAZOLAM HCL 2 MG/2ML IJ SOLN
INTRAMUSCULAR | Status: DC | PRN
  Administered 2022-01-15: 2 mg via INTRAVENOUS

## 2022-01-15 MED ORDER — SODIUM CHLORIDE 0.9 % IV SOLN: INTRAVENOUS | Status: DC

## 2022-01-15 MED ORDER — CHLORHEXIDINE GLUCONATE 0.12 % MT SOLN: 15.0000 mL | Freq: Once | OROMUCOSAL | Status: AC

## 2022-01-15 MED ORDER — PANTOPRAZOLE SODIUM 40 MG PO TBEC
40.0000 mg | DELAYED_RELEASE_TABLET | Freq: Every day | ORAL | Status: DC
  Administered 2022-01-16: 40 mg via ORAL
  Filled 2022-01-15: qty 1

## 2022-01-15 MED ORDER — SODIUM CHLORIDE 0.9% FLUSH
3.0000 mL | Freq: Two times a day (BID) | INTRAVENOUS | Status: DC
  Administered 2022-01-15: 3 mL via INTRAVENOUS

## 2022-01-15 MED ORDER — LIDOCAINE 2% (20 MG/ML) 5 ML SYRINGE
INTRAMUSCULAR | Status: AC
  Filled 2022-01-15: qty 5

## 2022-01-15 MED ORDER — 0.9 % SODIUM CHLORIDE (POUR BTL) OPTIME
TOPICAL | Status: DC | PRN
  Administered 2022-01-15: 1000 mL

## 2022-01-15 MED ORDER — OXYCODONE HCL 5 MG/5ML PO SOLN: 5.0000 mg | Freq: Once | ORAL | Status: AC | PRN

## 2022-01-15 MED ORDER — MENTHOL 3 MG MT LOZG: 1.0000 | LOZENGE | OROMUCOSAL | Status: DC | PRN

## 2022-01-15 MED ORDER — FENTANYL CITRATE (PF) 100 MCG/2ML IJ SOLN
25.0000 ug | INTRAMUSCULAR | Status: DC | PRN
  Administered 2022-01-15: 50 ug via INTRAVENOUS
  Administered 2022-01-15 (×2): 25 ug via INTRAVENOUS

## 2022-01-15 MED ORDER — SODIUM CHLORIDE 0.9 % IV SOLN: 250.0000 mL | INTRAVENOUS | Status: DC

## 2022-01-15 MED ORDER — LACTATED RINGERS IV SOLN: INTRAVENOUS | Status: DC

## 2022-01-15 SURGICAL SUPPLY — 47 items
BAG COUNTER SPONGE SURGICOUNT (BAG) ×1 IMPLANT
BUR ROUND FLUTED 4 SOFT TCH (BURR) IMPLANT
CANISTER SUCT 3000ML PPV (MISCELLANEOUS) ×1 IMPLANT
CLSR STERI-STRIP ANTIMIC 1/2X4 (GAUZE/BANDAGES/DRESSINGS) ×1 IMPLANT
COVER SURGICAL LIGHT HANDLE (MISCELLANEOUS) ×1 IMPLANT
DRAPE HALF SHEET 40X57 (DRAPES) ×2 IMPLANT
DRAPE MICROSCOPE SLANT 54X150 (MISCELLANEOUS) ×1 IMPLANT
DRAPE SURG 17X23 STRL (DRAPES) ×1 IMPLANT
DRSG MEPILEX BORDER 4X4 (GAUZE/BANDAGES/DRESSINGS) ×1 IMPLANT
DRSG MEPILEX BORDER 4X8 (GAUZE/BANDAGES/DRESSINGS) IMPLANT
DURAPREP 26ML APPLICATOR (WOUND CARE) ×1 IMPLANT
ELECT BLADE 4.0 EZ CLEAN MEGAD (MISCELLANEOUS) ×1
ELECT REM PT RETURN 9FT ADLT (ELECTROSURGICAL) ×1
ELECTRODE BLDE 4.0 EZ CLN MEGD (MISCELLANEOUS) IMPLANT
ELECTRODE REM PT RTRN 9FT ADLT (ELECTROSURGICAL) ×1 IMPLANT
GLOVE BIOGEL PI IND STRL 8 (GLOVE) ×2 IMPLANT
GLOVE ORTHO TXT STRL SZ7.5 (GLOVE) ×2 IMPLANT
GOWN STRL REUS W/ TWL LRG LVL3 (GOWN DISPOSABLE) ×2 IMPLANT
GOWN STRL REUS W/ TWL XL LVL3 (GOWN DISPOSABLE) ×1 IMPLANT
GOWN STRL REUS W/TWL 2XL LVL3 (GOWN DISPOSABLE) ×1 IMPLANT
GOWN STRL REUS W/TWL LRG LVL3 (GOWN DISPOSABLE) ×2
GOWN STRL REUS W/TWL XL LVL3 (GOWN DISPOSABLE) ×1
KIT BASIN OR (CUSTOM PROCEDURE TRAY) ×1 IMPLANT
KIT TURNOVER KIT B (KITS) ×1 IMPLANT
MANIFOLD NEPTUNE II (INSTRUMENTS) ×1 IMPLANT
NDL HYPO 25GX1X1/2 BEV (NEEDLE) ×1 IMPLANT
NDL SPNL 18GX3.5 QUINCKE PK (NEEDLE) ×1 IMPLANT
NEEDLE HYPO 25GX1X1/2 BEV (NEEDLE) ×1 IMPLANT
NEEDLE SPNL 18GX3.5 QUINCKE PK (NEEDLE) ×1 IMPLANT
NS IRRIG 1000ML POUR BTL (IV SOLUTION) ×1 IMPLANT
PACK LAMINECTOMY ORTHO (CUSTOM PROCEDURE TRAY) ×1 IMPLANT
PAD ARMBOARD 7.5X6 YLW CONV (MISCELLANEOUS) ×2 IMPLANT
PATTIES SURGICAL .5 X.5 (GAUZE/BANDAGES/DRESSINGS) IMPLANT
PATTIES SURGICAL .5 X1 (DISPOSABLE) IMPLANT
PATTIES SURGICAL .75X.75 (GAUZE/BANDAGES/DRESSINGS) IMPLANT
SPIKE FLUID TRANSFER (MISCELLANEOUS) ×1 IMPLANT
SPONGE T-LAP 18X18 ~~LOC~~+RFID (SPONGE) IMPLANT
SURGIFLO W/THROMBIN 8M KIT (HEMOSTASIS) IMPLANT
SUT VIC AB 0 CT1 27 (SUTURE)
SUT VIC AB 0 CT1 27XBRD ANBCTR (SUTURE) IMPLANT
SUT VIC AB 1 CTX 36 (SUTURE) ×1
SUT VIC AB 1 CTX36XBRD ANBCTR (SUTURE) ×1 IMPLANT
SUT VIC AB 2-0 CT1 27 (SUTURE) ×1
SUT VIC AB 2-0 CT1 TAPERPNT 27 (SUTURE) ×1 IMPLANT
SUT VIC AB 3-0 X1 27 (SUTURE) ×1 IMPLANT
TOWEL GREEN STERILE (TOWEL DISPOSABLE) ×1 IMPLANT
TOWEL GREEN STERILE FF (TOWEL DISPOSABLE) ×1 IMPLANT

## 2022-01-15 NOTE — Interval H&P Note (Signed)
History and Physical Interval Note:  01/15/2022 11:17 AM  Russell Green  has presented today for surgery, with the diagnosis of L4-5 spinal stenosis, facet cyst.  The various methods of treatment have been discussed with the patient and family. After consideration of risks, benefits and other options for treatment, the patient has consented to  Procedure(s): L4-5 LUMBAR DECOMPRESSION, REMOVAL OF L4-5 FACET CYST (N/A) as a surgical intervention.  The patient's history has been reviewed, patient examined, no change in status, stable for surgery.  I have reviewed the patient's chart and labs.  Questions were answered to the patient's satisfaction.     [name]

## 2022-01-15 NOTE — Transfer of Care (Signed)
Immediate Anesthesia Transfer of Care Note  Patient: Russell Green  Procedure(s) Performed: L4-5 LUMBAR DECOMPRESSION, REMOVAL OF L4-5 FACET CYST (Spine Lumbar)  Patient Location: PACU  Anesthesia Type:General  Level of Consciousness: drowsy, patient cooperative and responds to stimulation  Airway & Oxygen Therapy: Patient Spontanous Breathing and Patient connected to nasal cannula oxygen  Post-op Assessment: Report given to RN, Post -op Vital signs reviewed and stable and Patient moving all extremities X 4  Post vital signs: Reviewed and stable  Last Vitals:  Vitals Value Taken Time  BP 128/78 01/15/22 1437  Temp    Pulse 82 01/15/22 1439  Resp 16 01/15/22 1439  SpO2 100 % 01/15/22 1439  Vitals shown include unvalidated device data.  Last Pain:  Vitals:   01/15/22 0955  TempSrc:   PainSc: 6          Complications: No notable events documented.

## 2022-01-15 NOTE — Op Note (Signed)
Pre and postop diagnosis: L4-5 lumbar spinal stenosis with intraspinal intradural facet cyst.  Procedure: L5 laminectomy and partial L4 for removal of intraspinal extradural facet cyst causing lumbar spinal stenosis.Microscope assisted.  Surgeon: Rodell Perna, MD  Assistant: Benjiman Core, PA-C medically necessary and present for the entire procedure  Anesthesia General anesthesia plus Marcaine skin local.  EBL: 200 cc   Findings large extradural intraspinal facet cyst with extradural compression.  Procedure: After induction general esthesia patient placed prone on chest rolls careful padding positioning arms at 99 yellow pads Bumpers IV TXA and 2 g Ancef prophylaxis.  Back and been marked with a sterile marker preoperatively.  Area was prepped with DuraPrep once it was dry there is squared with towels Betadine Steri-Drape applied and laminectomy sheets and drapes.  Spinal needle was placed above and below based on palpable landmarks confirmed with a crosstable lateral x-ray.  Midline incision was made after timeout subperiosteal dissection of the lamina and placement of the McCullough retractor using the deepest blades double stacked within blades and thick blade.  Kocher clamps were placed above and below for there is a planned decompression for complete laminectomy at L5 since the facet cyst could come off the L4-5 level and was inferior to the L4-5 disc space.  Spinous process was removed L5 5 lamina was thinned with bur and also osteotome.  Chunks of bone remaining were removed and decompression was performed on the opposite left side first using the operative microscope protecting the dura with patties in [place].  Left side was free and on the right side there was extensive adherence to the dura with large facet cyst that was filled with cheesy type material.  Was carefully peeled off the nerve root and dura thick chunks of ligament were removed as well bone was removed at the level of the pedicle  and we continued until we were at the inferior aspect of the pedicle which corresponded with the inferior aspect of the lesion.  Carefully peeled off the dura and extended up taking laminotomy at L4 on the right side make sure thick chunks of ligament were all removed overhanging spurs for central decompression.  Copious irrigation.  Bipolar was used.  Gutters as well as some Surgiflo with patties.  Dura was intact nice round tube without compression copious irrigation and then standard layered closure with #1 Vicryl 2-0 Vicryl subtenons tissue skin staple closure.  Marcaine infiltration postop dressing and transfer to the PACU.

## 2022-01-15 NOTE — Anesthesia Procedure Notes (Signed)
Procedure Name: Intubation Date/Time: 01/15/2022 12:42 PM  Performed by: Michele Rockers, CRNAPre-anesthesia Checklist: Patient identified, Patient being monitored, Timeout performed, Emergency Drugs available and Suction available Patient Re-evaluated:Patient Re-evaluated prior to induction Oxygen Delivery Method: Circle system utilized Preoxygenation: Pre-oxygenation with 100% oxygen Induction Type: IV induction Ventilation: Mask ventilation without difficulty and Oral airway inserted - appropriate to patient size Laryngoscope Size: Mac, Glidescope and 4 Grade View: Grade I Tube type: Oral Tube size: 7.5 mm Number of attempts: 1 Airway Equipment and Method: Stylet and Video-laryngoscopy Placement Confirmation: ETT inserted through vocal cords under direct vision, positive ETCO2 and breath sounds checked- equal and bilateral Secured at: 22 cm Tube secured with: Tape Dental Injury: Teeth and Oropharynx as per pre-operative assessment

## 2022-01-15 NOTE — Interval H&P Note (Signed)
History and Physical Interval Note:  01/15/2022 11:16 AM  Russell Green  has presented today for surgery, with the diagnosis of L4-5 spinal stenosis, facet cyst.  The various methods of treatment have been discussed with the patient and family. After consideration of risks, benefits and other options for treatment, the patient has consented to  Procedure(s): L4-5 LUMBAR DECOMPRESSION, REMOVAL OF L4-5 FACET CYST (N/A) as a surgical intervention.  The patient's history has been reviewed, patient examined, no change in status, stable for surgery.  I have reviewed the patient's chart and labs.  Questions were answered to the patient's satisfaction.     [name]

## 2022-01-15 NOTE — Discharge Instructions (Signed)
Ok to shower 5 days postop.  Do not apply any creams or ointments to incision.   Do not remove dressing.     No aggressive activity.  No twisting,bending, lifting, pushing, squatting or prolonged sitting.  Mostly be in reclined position or lying down.    No driving

## 2022-01-16 ENCOUNTER — Encounter (HOSPITAL_COMMUNITY): Payer: Self-pay | Admitting: Orthopaedic Surgery

## 2022-01-16 DIAGNOSIS — M48061 Spinal stenosis, lumbar region without neurogenic claudication: Secondary | ICD-10-CM | POA: Diagnosis not present

## 2022-01-16 NOTE — Progress Notes (Signed)
Patient ID: Russell Green, male   DOB: 06/12/1970, 51 y.o.   MRN: [id number]   Subjective: 1 Day Post-Op Procedure(s) (LRB): L4-5 LUMBAR DECOMPRESSION, REMOVAL OF L4-5 FACET CYST (N/A) Patient reports pain as mild.  " My leg feels great"  Objective: Vital signs in last 24 hours: Temp:  [97.8 F (36.6 C)-99 F (37.2 C)] 99 F (37.2 C) (09/12 0810) Pulse Rate:  [71-97] 87 (09/12 0810) Resp:  [14-20] 17 (09/12 0810) BP: (112-142)/(68-91) 124/68 (09/12 0810) SpO2:  [96 %-100 %] 98 % (09/12 0810) Weight:  [112.9 kg] 112.9 kg (09/11 0934)  Intake/Output from previous day: 09/11 0701 - 09/12 0700 In: 1000 [I.V.:1000] Out: 50 [Blood:50] Intake/Output this shift: No intake/output data recorded.  No results for input(s): "HGB" in the last 72 hours. No results for input(s): "WBC", "RBC", "HCT", "PLT" in the last 72 hours. No results for input(s): "NA", "K", "CL", "CO2", "BUN", "CREATININE", "GLUCOSE", "CALCIUM" in the last 72 hours. No results for input(s): "LABPT", "INR" in the last 72 hours.  Neurologically intact DG Lumbar Spine 2-3 Views  Result Date: 01/15/2022 CLINICAL DATA:  Intraoperative localization EXAM: LUMBAR SPINE - 2-3 VIEW COMPARISON:  None Available. FINDINGS: Lateral intraoperative views of the lumbar spine demonstrate marking needles dorsally overlying the L4 and L5 vertebral bodies and subsequently retracting instruments over L4-L5. IMPRESSION: Lateral intraoperative views of the lumbar spine demonstrate marking needles dorsally overlying the L4 and L5 vertebral bodies and subsequently retracting instruments over L4-L5. Electronically Signed   By: Delanna Ahmadi M.D.   On: 01/15/2022 15:59    Assessment/Plan: 1 Day Post-Op Procedure(s) (LRB): L4-5 LUMBAR DECOMPRESSION, REMOVAL OF L4-5 FACET CYST (N/A) Up with therapy, discharge home.   Russell Green 01/16/2022, 8:31 AM

## 2022-01-16 NOTE — Anesthesia Postprocedure Evaluation (Signed)
Anesthesia Post Note  Patient: Russell Green  Procedure(s) Performed: L4-5 LUMBAR DECOMPRESSION, REMOVAL OF L4-5 FACET CYST (Spine Lumbar)     Patient location during evaluation: PACU Anesthesia Type: General Level of consciousness: awake and alert Pain management: pain level controlled Vital Signs Assessment: post-procedure vital signs reviewed and stable Respiratory status: spontaneous breathing, nonlabored ventilation, respiratory function stable and patient connected to nasal cannula oxygen Cardiovascular status: blood pressure returned to baseline and stable Postop Assessment: no apparent nausea or vomiting Anesthetic complications: no   No notable events documented.  Last Vitals:  Vitals:   01/16/22 0609 01/16/22 0810  BP: 113/84 124/68  Pulse: 81 87  Resp: 20 17  Temp: 36.7 C 37.2 C  SpO2: 99% 98%    Last Pain:  Vitals:   01/16/22 0810  TempSrc: Oral  PainSc:                  [place]

## 2022-01-16 NOTE — Evaluation (Signed)
Physical Therapy Evaluation and Discharge Patient Details Name: Russell Green MRN: [id number] DOB: 05-11-1970 Today's Date: 01/16/2022  History of Present Illness  Pt is a 51 y.o male s/p L5 laminectomy and partial L4 removal of intraspinal extradural facet cyst causing lumbar spinal stenosis. PMH significant for lower R quadrant pain, myasthenia gravis, and osteoarthritis of L knee.  Clinical Impression   Patient evaluated by Physical Therapy with no further acute PT needs identified. All education has been completed and the patient has no further questions. Patient tolerated ambulation x 90 ft with RW and able to ascend/descend 6 steps with one rail with minguard assist.  See below for any follow-up Physical Therapy or equipment needs. PT is signing off. Thank you for this referral.        Recommendations for follow up therapy are one component of a multi-disciplinary discharge planning process, led by the attending physician.  Recommendations may be updated based on patient status, additional functional criteria and insurance authorization.  Follow Up Recommendations Follow physician's recommendations for discharge plan and follow up therapies      Assistance Recommended at Discharge Intermittent Supervision/Assistance  Patient can return home with the following  A little help with walking and/or transfers;A little help with bathing/dressing/bathroom;Assistance with cooking/housework;Help with stairs or ramp for entrance    Equipment Recommendations Rolling walker (2 wheels)  Recommendations for Other Services       Functional Status Assessment Patient has had a recent decline in their functional status and demonstrates the ability to make significant improvements in function in a reasonable and predictable amount of time.     Precautions / Restrictions Precautions Precautions: Back Precaution Booklet Issued: Yes (comment) Precaution Comments: able to recall 3/3 precautions and no  cues needed to adhere Restrictions Weight Bearing Restrictions: No      Mobility  Bed Mobility Overal bed mobility: Needs Assistance Bed Mobility: Rolling, Sit to Sidelying Rolling: Modified independent (Device/Increase time)       Sit to sidelying: Min assist General bed mobility comments: return to sidelying required min assist for second leg to raise onto bed; rolled with no cues needed    Transfers Overall transfer level: Needs assistance Equipment used: Rolling walker (2 wheels) Transfers: Sit to/from Stand Sit to Stand: Min guard           General transfer comment: vc for reaching back for furniture    Ambulation/Gait Ambulation/Gait assistance: Min guard Gait Distance (Feet): 90 Feet Assistive device: Rolling walker (2 wheels) Gait Pattern/deviations: Step-through pattern, Decreased stride length   Gait velocity interpretation: 1.31 - 2.62 ft/sec, indicative of limited community ambulator   General Gait Details: short step-length; good upright posture  Stairs            Wheelchair Mobility    Modified Rankin (Stroke Patients Only)       Balance Overall balance assessment: Mild deficits observed, not formally tested                                           Pertinent Vitals/Pain Pain Assessment Pain Assessment: 0-10 Pain Score: 6  Pain Location: operative site Pain Descriptors / Indicators: Operative site guarding, Discomfort Pain Intervention(s): Limited activity within patient's tolerance, Monitored during session    Home Living Family/patient expects to be discharged to:: Private residence Living Arrangements: Spouse/significant other Available Help at Discharge: Family Type of Home: [place]  Access: Stairs to enter Entrance Stairs-Rails: Psychiatric nurse of Steps: 6   Home Layout: One level Home Equipment: Shower seat      Prior Function Prior Level of Function : Independent/Modified  Independent;Driving;Working/employed             Mobility Comments: Independent, working in home health, assisting people with movement ADLs Comments: Occasional assist from wife with bathing     Hand Dominance        Extremity/Trunk Assessment   Upper Extremity Assessment Upper Extremity Assessment: Defer to OT evaluation    Lower Extremity Assessment Lower Extremity Assessment: Generalized weakness    Cervical / Trunk Assessment Cervical / Trunk Assessment: Back Surgery  Communication   Communication: No difficulties  Cognition Arousal/Alertness: Awake/alert Behavior During Therapy: WFL for tasks assessed/performed Overall Cognitive Status: Within Functional Limits for tasks assessed                                          General Comments General comments (skin integrity, edema, etc.): wife present; questions re: dressing and deferred for RN to address    Exercises     Assessment/Plan    PT Assessment Patient does not need any further PT services  PT Problem List         PT Treatment Interventions      PT Goals (Current goals can be found in the Care Plan section)  Acute Rehab PT Goals Patient Stated Goal: return to work eventually PT Goal Formulation: All assessment and education complete, DC therapy    Frequency       Co-evaluation               AM-PAC PT "6 Clicks" Mobility  Outcome Measure Help needed turning from your back to your side while in a flat bed without using bedrails?: None Help needed moving from lying on your back to sitting on the side of a flat bed without using bedrails?: None Help needed moving to and from a bed to a chair (including a wheelchair)?: A Little Help needed standing up from a chair using your arms (e.g., wheelchair or bedside chair)?: A Little Help needed to walk in hospital room?: A Little Help needed climbing 3-5 steps with a railing? : A Little 6 Click Score: 20    End of Session  Equipment Utilized During Treatment: Gait belt;Back brace Activity Tolerance: Patient tolerated treatment well Patient left: in bed;with call bell/phone within reach;with family/visitor present Nurse Communication: Mobility status;Other (comment) (ok to dc; questions re: bandage) PT Visit Diagnosis: Other abnormalities of gait and mobility (R26.89)    Time: 5883-2549 PT Time Calculation (min) (ACUTE ONLY): 9 min   Charges:   PT Evaluation $PT Eval Low Complexity: [place], PT Acute Rehabilitation Services  Office [phone]   Rexanne Mano 01/16/2022, 9:58 AM

## 2022-01-16 NOTE — Evaluation (Signed)
Occupational Therapy Evaluation Patient Details Name: Russell Green MRN: [id number] DOB: Feb 08, 1971 Today's Date: 01/16/2022   History of Present Illness Pt is a 51 y.o male s/p L5 laminectomy and partial L4 removal of intraspinal extradural facet cyst causing lumbar spinal stenosis. PMH significant for lower R quadrant pain, myasthenia gravis, and osteoarthritis of L knee.   Clinical Impression   PTA, pt lived with his wife who occasionally assisted with bathing. 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 Upon eval, pt performing UB ADL with min A and LB ADL with max A. Wife independent assisting. Pt performing functional mobility with min guard A with use of RW. Pt educated and demonstrating use of compensatory techniques for LB dressing, bed mobility, oral care, and tub transfer. All education provided; all questions answered. Recommend discharge home with no follow up OT. OT to sign off. Re-consult if change in status.      Recommendations for follow up therapy are one component of a multi-disciplinary discharge planning process, led by the attending physician.  Recommendations may be updated based on patient status, additional functional criteria and insurance authorization.   Follow Up Recommendations  No OT follow up    Assistance Recommended at Discharge Intermittent Supervision/Assistance  Patient can return home with the following A little help with walking and/or transfers;A little help with bathing/dressing/bathroom;Assistance with cooking/housework;Assist for transportation;Help with stairs or ramp for entrance    Functional Status Assessment  Patient has had a recent decline in their functional status and demonstrates the ability to make significant improvements in function in a reasonable and predictable amount of time.  Equipment Recommendations  BSC/3in1    Recommendations for Other Services       Precautions / Restrictions Precautions Precautions: Back Precaution  Booklet Issued: Yes (comment) Precaution Comments: all education reviewed Restrictions Weight Bearing Restrictions: No      Mobility Bed Mobility Overal bed mobility: Needs Assistance Bed Mobility: Rolling, Sidelying to Sit Rolling: Supervision Sidelying to sit: Supervision       General bed mobility comments: Pt coming to EOB with supervision. Appropriate use of log roll    Transfers Overall transfer level: Needs assistance Equipment used: Rolling walker (2 wheels) Transfers: Sit to/from Stand Sit to Stand: Min guard           General transfer comment: Min guard A for safety. Increased time and effort. Cues for technique      Balance Overall balance assessment: Mild deficits observed, not formally tested                                         ADL either performed or assessed with clinical judgement   ADL Overall ADL's : Needs assistance/impaired Eating/Feeding: Modified independent   Grooming: Min guard;Standing   Upper Body Bathing: Sitting;Set up   Lower Body Bathing: Min guard;Sit to/from stand   Upper Body Dressing : Minimal assistance;Standing Upper Body Dressing Details (indicate cue type and reason): wife providing A Lower Body Dressing: Maximal assistance;Sit to/from stand Lower Body Dressing Details (indicate cue type and reason): Wife providing assist. Toilet Transfer: Min guard;Ambulation;Rolling walker (2 wheels);Comfort height toilet;Grab bars Toilet Transfer Details (indicate cue type and reason): Simulated in session     Tub/ Shower Transfer: Min guard;Tub transfer;Shower seat;Ambulation;Rolling walker (2 wheels) Tub/Shower Transfer Details (indicate cue type and reason): Performing in session using compensatory techniques Functional mobility during ADLs: Min guard;Rolling  walker (2 wheels)       Vision Baseline Vision/History: 1 Wears glasses Ability to See in Adequate Light: 0 Adequate Patient Visual Report: No change  from baseline Vision Assessment?: No apparent visual deficits Additional Comments: wearing glasses     Perception     Praxis      Pertinent Vitals/Pain Pain Assessment Pain Assessment: 0-10 Pain Score: 6  Pain Location: operative site Pain Descriptors / Indicators: Operative site guarding, Discomfort Pain Intervention(s): Limited activity within patient's tolerance, Monitored during session     Hand Dominance     Extremity/Trunk Assessment Upper Extremity Assessment Upper Extremity Assessment: Generalized weakness (Shaky in BUE with prolonged standing while supporting self with RW)   Lower Extremity Assessment Lower Extremity Assessment: Defer to PT evaluation   Cervical / Trunk Assessment Cervical / Trunk Assessment: Back Surgery   Communication Communication Communication: No difficulties   Cognition Arousal/Alertness: Awake/alert Behavior During Therapy: WFL for tasks assessed/performed Overall Cognitive Status: Within Functional Limits for tasks assessed                                 General Comments: Pt recalling all precautions     General Comments  VSS. Wife present and assisting with LB ADL    Exercises     Shoulder Instructions      Home Living Family/patient expects to be discharged to:: Private residence Living Arrangements: Spouse/significant other Available Help at Discharge: Family Type of Home: House Home Access: Stairs to enter Technical brewer of Steps: 6 Entrance Stairs-Rails: Right;Left Home Layout: One level     Bathroom Shower/Tub: International aid/development worker Accessibility: Yes How Accessible: Accessible via walker Home Equipment: Shower seat          Prior Functioning/Environment Prior Level of Function : Independent/Modified Independent;Driving;Working/employed             Mobility Comments: Independent, working in home health, assisting people with movement ADLs Comments: Occasional assist from  wife with bathing        OT Problem List: Decreased strength;Decreased activity tolerance;Impaired balance (sitting and/or standing);Decreased knowledge of precautions;Decreased knowledge of use of DME or AE;Pain      OT Treatment/Interventions:      OT Goals(Current goals can be found in the care plan section) Acute Rehab OT Goals Patient Stated Goal: go home OT Goal Formulation: With patient/family  OT Frequency:      Co-evaluation              AM-PAC OT "6 Clicks" Daily Activity     Outcome Measure Help from another person eating meals?: None Help from another person taking care of personal grooming?: A Little Help from another person toileting, which includes using toliet, bedpan, or urinal?: A Little Help from another person bathing (including washing, rinsing, drying)?: A Little Help from another person to put on and taking off regular upper body clothing?: A Little Help from another person to put on and taking off regular lower body clothing?: A Lot 6 Click Score: 18   End of Session Equipment Utilized During Treatment: Gait belt;Rolling walker (2 wheels) Nurse Communication: Mobility status  Activity Tolerance: Patient tolerated treatment well Patient left: in bed;Other (comment) (siting EOB with PT)  OT Visit Diagnosis: Unsteadiness on feet (R26.81);Muscle weakness (generalized) (M62.81)                Time: 7062-3762 OT Time Calculation (min): 20 min  Charges:  OT General Charges $OT Visit: 1 Visit OT Evaluation $OT Eval Low Complexity: [place], OTR/L [facility] Acute Rehabilitation Office: [phone]   Lula Olszewski 01/16/2022, 9:33 AM

## 2022-01-16 NOTE — Progress Notes (Signed)
Patient alert and oriented, voiding adequately, skin clean, dry and intact without evidence of skin break down, or symptoms of complications - no redness or edema noted, only slight tenderness at site.  Patient states pain is manageable at time of discharge. Patient has an appointment with MD in 1 week 

## 2022-01-17 ENCOUNTER — Telehealth: Payer: Self-pay | Admitting: Radiology

## 2022-01-17 NOTE — Telephone Encounter (Signed)
Patient is status post lumbar spine surgery on 01/15/2022. He called and states that he has blood on his bandage and his incision is bleeding. He noticed this after he sat down to go to the bathroom. He does not have any drainage/blood coming out of the bandage, just has a couple of spots. I advised that he probably pulled something when sitting to go to the bathroom, however, we would like to see him in the office to check the incision and change his bandage. He is only able to come in the afternoon due to transportation. Appt scheduled to see Jeneen Rinks for wound check and bandage change 01/18/2022 at 3pm.

## 2022-01-18 ENCOUNTER — Ambulatory Visit (INDEPENDENT_AMBULATORY_CARE_PROVIDER_SITE_OTHER): Payer: 59 | Admitting: Surgery

## 2022-01-18 ENCOUNTER — Encounter: Payer: Self-pay | Admitting: Surgery

## 2022-01-18 VITALS — Ht 73.0 in | Wt 249.0 lb

## 2022-01-18 DIAGNOSIS — Z9889 Other specified postprocedural states: Secondary | ICD-10-CM

## 2022-01-23 ENCOUNTER — Telehealth: Payer: Self-pay

## 2022-01-23 ENCOUNTER — Telehealth: Payer: Self-pay | Admitting: Neurology

## 2022-01-23 ENCOUNTER — Ambulatory Visit (INDEPENDENT_AMBULATORY_CARE_PROVIDER_SITE_OTHER): Payer: 59 | Admitting: Orthopaedic Surgery

## 2022-01-23 ENCOUNTER — Encounter: Payer: Self-pay | Admitting: Orthopaedic Surgery

## 2022-01-23 DIAGNOSIS — Z9889 Other specified postprocedural states: Secondary | ICD-10-CM | POA: Insufficient documentation

## 2022-01-23 DIAGNOSIS — G7000 Myasthenia gravis without (acute) exacerbation: Secondary | ICD-10-CM

## 2022-01-23 MED ORDER — PREDNISONE 10 MG PO TABS: 20.0000 mg | ORAL_TABLET | Freq: Two times a day (BID) | ORAL | 2 refills | Status: DC

## 2022-01-23 NOTE — Telephone Encounter (Signed)
Pt called needing a refill request for his predniSONE (DELTASONE) 10 MG tablets sent to the [facility] on [address]

## 2022-01-23 NOTE — Telephone Encounter (Signed)
Returned call to patient. States he needs Rxs sent to reflect doses he is taking:  Mestinon is 60 mg BID  Prednisone 20 mg q AM with Breakfast. This Rx has expired off med list.

## 2022-01-23 NOTE — Telephone Encounter (Signed)
Pt is asking for refill of prednisone 20 mg daily. This is not listed as an active refill on his medication list. Will have MD review and sign if appropriate.

## 2022-01-23 NOTE — Addendum Note (Signed)
Addended by: Marcial Pacas on: 01/23/2022 02:44 PM   Modules accepted: Orders

## 2022-01-23 NOTE — Telephone Encounter (Signed)
Pt added to wait list.

## 2022-01-23 NOTE — Addendum Note (Signed)
Addended by: Verlin Grills on: 01/23/2022 10:57 AM   Modules accepted: Orders

## 2022-01-23 NOTE — Telephone Encounter (Signed)
These are not the doses reported in Dr. Guerry Bruin note (see plan for [facility]) and he is supposed to follow up with Neurology to have these Rx taken over.  I am confused why he reported these doses previously ('10mg'$  of pred and '30mg'$  TID of mestinon), has he seen a new provider who changed the doses?  Thanks.  Need more information before change.

## 2022-01-23 NOTE — Progress Notes (Signed)
Patient comes in today for wound check.  Call the office earlier stating that his dressing had bloody drainage.  Status post L4-5 decompression January 15, 2022.   Exam Patient has the same dressing on nose applied at the hospital before discharge and he has been showering with this.  Dressing is wet with old blood on the dressing.  Incision looks good.  Staples intact.  No drainage or signs of infection.   Plan Advised patient to hold off on showering over the next couple days since his wound has been staying wet with the dressing that has been on.  Do not apply any creams or ointments.  Daily dressing changes with 4 x 4 gauze and tape.  Follow-up with Dr. Lorin Mercy next week as scheduled.

## 2022-01-23 NOTE — Progress Notes (Signed)
Post-Op Visit Note   Patient: Russell Green           Date of Birth: [birth year]           MRN: [id number] Visit Date: 01/23/2022 PCP: Lottie Mussel, MD   Assessment & Plan: 51 year old male with myasthenia gravis post L4-5 decompression removal of intraspinal extradural large facet cyst.  Patient had postoperative bruising is looking better.  Return 1 week for staple removal.  New dressing applied.  He still has some mild discomfort around his right greater trochanter.  Facet cyst was on the right side.  He has been on chronic oxycodone 10/325 which she was taking before the surgery for his back problems.  He is use the muscle relaxant judiciously.  He is walking better with his walking stick.  Recheck 1 week for staple removal.  Chief Complaint:  Chief Complaint  Patient presents with   Lower Back - Routine Post Op    01/15/2022 L4-5 decompression, removal of L4-5 facet cyst   Visit Diagnoses:  1. History of lumbar laminectomy for spinal cord decompression   2. Status post lumbar spine surgery for decompression of spinal cord     Plan: ROV one week  Follow-Up Instructions: No follow-ups on file.   Orders:  No orders of the defined types were placed in this encounter.  No orders of the defined types were placed in this encounter.   Imaging: No results found.  PMFS History: Patient Active Problem List   Diagnosis Date Noted   History of lumbar laminectomy for spinal cord decompression 01/23/2022   Status post lumbar spine surgery for decompression of spinal cord 01/23/2022   Lumbar stenosis 01/15/2022   Spinal stenosis of lumbar region 12/13/2021   Right lower quadrant pain 09/13/2021   Prediabetes 02/09/2021   Chronic midline low back pain with bilateral sciatica 06/29/2020   Primary osteoarthritis of left knee 06/29/2020   Pain medication agreement signed 12/21/2019   Healthcare maintenance 12/17/2011   Myasthenia gravis without exacerbation ([place]) 02/19/2011   Past  Medical History:  Diagnosis Date   Arthritis    Asthma    childhood issue   Atrial fibrillation, transient ([place])    aflutter 10/2010   Dysrhythmia    History of prediabetes    Myasthenia gravis ([place]) 12/2010   Pre-diabetes    per pt report   Thyroid disease    hyperthyroidism, s/p RAI    Family History  Problem Relation Age of Onset   Cancer Mother        unsure of type   Other Father        unsure of medical history   Healthy Sister    Healthy Brother     Past Surgical History:  Procedure Laterality Date   A-FLUTTER ABLATION     LUMBAR LAMINECTOMY/DECOMPRESSION MICRODISCECTOMY N/A 01/15/2022   Procedure: L4-5 LUMBAR DECOMPRESSION, REMOVAL OF L4-5 FACET CYST;  Surgeon: [name], MD;  Location: [place];  Service: Orthopedics;  Laterality: N/A;   TRACHEAL SURGERY  2012   WISDOM TOOTH EXTRACTION     Social History   Occupational History   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

## 2022-01-23 NOTE — Telephone Encounter (Signed)
Requesting to speak with a nurse about the dosage on Prednisone and pyridostigmine (MESTINON) 60 MG tablet. Please call back.

## 2022-01-23 NOTE — Telephone Encounter (Signed)
He has lost follow-up since October 2022, canceled his follow-up appointment January 2023, I will refill his prednisone to his next follow-up scheduled for November, if possible move up his follow-up appointment.  Meds ordered this encounter  Medications   predniSONE (DELTASONE) 10 MG tablet    Sig: Take 2 tablets (20 mg total) by mouth 2 (two) times daily with a meal.    Dispense:  60 tablet    Refill:  2

## 2022-01-24 ENCOUNTER — Other Ambulatory Visit: Payer: Self-pay | Admitting: Internal Medicine

## 2022-01-24 MED ORDER — PYRIDOSTIGMINE BROMIDE 60 MG PO TABS: 60.0000 mg | ORAL_TABLET | Freq: Two times a day (BID) | ORAL | 1 refills | Status: DC

## 2022-01-24 NOTE — Telephone Encounter (Signed)
Patient called back. States he told PCP that he used to take meds as described in her OV but that changed > 1 year ago. The doses he reported are what he has been taking. States he has an appt on 11/13 with Neurology and he will ask Dr. Krista Blue at that time to take over prescribing these meds.

## 2022-01-24 NOTE — Telephone Encounter (Signed)
Patient notified that Dr. Krista Blue refilled prednisone yesterday and [facility] Attending refilled Mestinon today. He is encouraged to contact Dr. Rhea Belton office to ensure he is taking the correct doses and frequency as what he states he is taking is different from PCP's and Neuro OV notes. He states he will and expresses appreciation.

## 2022-01-30 NOTE — Discharge Summary (Signed)
Patient ID: Russell Green MRN: [id number] DOB/AGE: 50-09-1970 50 y.o.  Admit date: 01/15/2022 Discharge date: 01/16/2022  Admission Diagnoses:  Principal Problem:   Lumbar stenosis   Discharge Diagnoses:  Principal Problem:   Lumbar stenosis  status post Procedure(s): L4-5 LUMBAR DECOMPRESSION, REMOVAL OF L4-5 FACET CYST  Past Medical History:  Diagnosis Date   Arthritis    Asthma    childhood issue   Atrial fibrillation, transient ([place])    aflutter 10/2010   Dysrhythmia    History of prediabetes    Myasthenia gravis ([place]) 12/2010   Pre-diabetes    per pt report   Thyroid disease    hyperthyroidism, s/p RAI    Surgeries: Procedure(s): L4-5 LUMBAR DECOMPRESSION, REMOVAL OF L4-5 FACET CYST on 01/15/2022   Consultants:   Discharged Condition: Improved  Hospital Course: Russell Green is an 51 y.o. male who was admitted 01/15/2022 for operative treatment of Lumbar stenosis. Patient failed conservative treatments (please see the history and physical for the specifics) and had severe unremitting pain that affects sleep, daily activities and work/hobbies. After pre-op clearance, the patient was taken to the operating room on 01/15/2022 and underwent  Procedure(s): L4-5 LUMBAR DECOMPRESSION, REMOVAL OF L4-5 FACET CYST.    Patient was given perioperative antibiotics:  Anti-infectives (From admission, onward)    Start     Dose/Rate Route Frequency Ordered Stop   01/15/22 0945  ceFAZolin (ANCEF) IVPB 2g/100 mL premix        2 g 200 mL/hr over 30 Minutes Intravenous On call to O.R. 01/15/22 0941 01/15/22 1300   01/15/22 0943  ceFAZolin (ANCEF) 2-4 GM/100ML-% IVPB       Note to Pharmacy: Demetrios Isaacs A: cabinet override      01/15/22 0943 01/15/22 1259        Patient was given sequential compression devices and early ambulation to prevent DVT.   Patient benefited maximally from hospital stay and there were no complications. At the time of discharge, the patient was  urinating/moving their bowels without difficulty, tolerating a regular diet, pain is controlled with oral pain medications and they have been cleared by PT/OT.   Recent vital signs: No data found.   Recent laboratory studies: No results for input(s): "WBC", "HGB", "HCT", "PLT", "NA", "K", "CL", "CO2", "BUN", "CREATININE", "GLUCOSE", "INR", "CALCIUM" in the last 72 hours.  Invalid input(s): "PT", "2"   Discharge Medications:   Allergies as of 01/16/2022       Reactions   Flexeril [cyclobenzaprine] Other (See Comments)   Restlessness        Medication List     STOP taking these medications    naproxen 500 MG tablet Commonly known as: Naprosyn   Oxycodone HCl 10 MG Tabs   predniSONE 10 MG (21) Tbpk tablet Commonly known as: STERAPRED UNI-PAK 21 TAB   predniSONE 10 MG tablet Commonly known as: DELTASONE   Voltaren 1 % Gel Generic drug: diclofenac Sodium       TAKE these medications    CALCIUM-VITAMIN D PO Take 1 tablet by mouth daily.   methocarbamol 500 MG tablet Commonly known as: ROBAXIN Take 1 tablet (500 mg total) by mouth every 6 (six) hours as needed for muscle spasms.   multivitamin-lutein Caps capsule Take 1 capsule by mouth daily.   omeprazole 20 MG capsule Commonly known as: PRILOSEC Take 20 mg by mouth daily.   oxyCODONE-acetaminophen 10-325 MG tablet Commonly known as: PERCOCET Take 1 tablet by mouth every 6 (six) hours as  needed for pain.        Diagnostic Studies: DG Lumbar Spine 2-3 Views  Result Date: 01/15/2022 CLINICAL DATA:  Intraoperative localization EXAM: LUMBAR SPINE - 2-3 VIEW COMPARISON:  None Available. FINDINGS: Lateral intraoperative views of the lumbar spine demonstrate marking needles dorsally overlying the L4 and L5 vertebral bodies and subsequently retracting instruments over L4-L5. IMPRESSION: Lateral intraoperative views of the lumbar spine demonstrate marking needles dorsally overlying the L4 and L5 vertebral bodies and  subsequently retracting instruments over L4-L5. Electronically Signed   By: Delanna Ahmadi M.D.   On: 01/15/2022 15:59    Discharge Instructions     Incentive spirometry RT   Complete by: As directed         Follow-up Information     [name], MD. Schedule an appointment as soon as possible for a visit.   Specialty: Orthopedic Surgery Why: NEED RETURN OFFICE VISIT ONE WEEK POSTOP WITH DR Verlene Mayer information: [address] [place] [address] [phone]                 Discharge Plan:  discharge to home  Disposition:     Signed: Benjiman Core  01/30/2022, 10:08 AM

## 2022-01-31 ENCOUNTER — Encounter: Payer: Self-pay | Admitting: Orthopaedic Surgery

## 2022-01-31 ENCOUNTER — Ambulatory Visit (INDEPENDENT_AMBULATORY_CARE_PROVIDER_SITE_OTHER): Payer: 59 | Admitting: Orthopaedic Surgery

## 2022-01-31 VITALS — BP 108/72 | HR 92 | Ht 73.0 in | Wt 249.0 lb

## 2022-01-31 DIAGNOSIS — Z9889 Other specified postprocedural states: Secondary | ICD-10-CM

## 2022-01-31 NOTE — Progress Notes (Signed)
   Post-Op Visit Note   Patient: Russell Green           Date of Birth: May 10, 1970           MRN: [id number] Visit Date: 01/31/2022 PCP: Lottie Mussel, MD   Assessment & Plan: Staples removed Steri-Strips applied incision looks good.  Good improvement in his preop pain.  Work slip given no work until 02/19/2022.  He does private duty work.  He can resume work on 02/19/2022 and I will check in for likely final visit in 1 month.  Chief Complaint:  Chief Complaint  Patient presents with   Lower Back - Routine Post Op, Follow-up    01/15/2022 L4-5 decompression, removal of L4-5 facet cyst   Visit Diagnoses:  1. History of lumbar laminectomy for spinal cord decompression     Plan: Recheck 1 month.  Follow-Up Instructions: Return in about 4 weeks (around 02/28/2022).   Orders:  No orders of the defined types were placed in this encounter.  No orders of the defined types were placed in this encounter.   Imaging: No results found.  PMFS History: Patient Active Problem List   Diagnosis Date Noted   History of lumbar laminectomy for spinal cord decompression 01/23/2022   Status post lumbar spine surgery for decompression of spinal cord 01/23/2022   Lumbar stenosis 01/15/2022   Spinal stenosis of lumbar region 12/13/2021   Right lower quadrant pain 09/13/2021   Prediabetes 02/09/2021   Chronic midline low back pain with bilateral sciatica 06/29/2020   Primary osteoarthritis of left knee 06/29/2020   Pain medication agreement signed 12/21/2019   Healthcare maintenance 12/17/2011   Myasthenia gravis without exacerbation ([place]) 02/19/2011   Past Medical History:  Diagnosis Date   Arthritis    Asthma    childhood issue   Atrial fibrillation, transient ([place])    aflutter 10/2010   Dysrhythmia    History of prediabetes    Myasthenia gravis ([place]) 12/2010   Pre-diabetes    per pt report   Thyroid disease    hyperthyroidism, s/p RAI    Family History  Problem Relation Age of  Onset   Cancer Mother        unsure of type   Other Father        unsure of medical history   Healthy Sister    Healthy Brother     Past Surgical History:  Procedure Laterality Date   A-FLUTTER ABLATION     LUMBAR LAMINECTOMY/DECOMPRESSION MICRODISCECTOMY N/A 01/15/2022   Procedure: L4-5 LUMBAR DECOMPRESSION, REMOVAL OF L4-5 FACET CYST;  Surgeon: [name], MD;  Location: [facility];  Service: Orthopedics;  Laterality: N/A;   TRACHEAL SURGERY  2012   WISDOM TOOTH EXTRACTION     Social History   Occupational History   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

## 2022-03-05 ENCOUNTER — Ambulatory Visit (INDEPENDENT_AMBULATORY_CARE_PROVIDER_SITE_OTHER): Payer: 59 | Admitting: Neurology

## 2022-03-05 ENCOUNTER — Telehealth: Payer: Self-pay

## 2022-03-05 ENCOUNTER — Encounter: Payer: Self-pay | Admitting: Neurology

## 2022-03-05 VITALS — BP 124/80 | HR 78 | Ht 73.0 in | Wt 256.0 lb

## 2022-03-05 DIAGNOSIS — H532 Diplopia: Secondary | ICD-10-CM | POA: Diagnosis not present

## 2022-03-05 DIAGNOSIS — G7000 Myasthenia gravis without (acute) exacerbation: Secondary | ICD-10-CM | POA: Diagnosis not present

## 2022-03-05 DIAGNOSIS — Z0000 Encounter for general adult medical examination without abnormal findings: Secondary | ICD-10-CM

## 2022-03-05 MED ORDER — PREDNISONE 10 MG PO TABS: 30.0000 mg | ORAL_TABLET | Freq: Two times a day (BID) | ORAL | 11 refills | Status: DC

## 2022-03-05 MED ORDER — PYRIDOSTIGMINE BROMIDE 60 MG PO TABS: 60.0000 mg | ORAL_TABLET | Freq: Two times a day (BID) | ORAL | 3 refills | Status: DC

## 2022-03-05 NOTE — Progress Notes (Signed)
Chief Complaint  Patient presents with   Follow-up    Rm 13, alone States he is stable refills needed     ASSESSMENT AND PLAN  Russell Green is a 51 y.o. male   Seronegative generalized myasthenia gravis  Previous significant bulbar, ocular, limb muscle weakness, required prolonged intubation, tracheostomy in 2012, was treated by [facility] Dr. Nadara Mustard  Significant improvement following plasma exchange,  CT chest that showed no thymus pathology  From patient and record, there is no evidence of using IVIG, and steroid sparing agent treatment in the past,   He still concerned about the long-term side effect of steroid sparing agent such as CellCept, Imuran, he wants to hold all dose agent right now  Has been on long-term prednisone treatment, 20 mg / 10 mg recently, still require Mestinon 60 mg up to 3 times a day for recurrent double vision,  I do think he would do better adding a steroid sparing agent to avoid a long-term side effect of prednisone, he is already diabetic, most recent A1c 6.7,  Also suggesting to prednisone 30 mg all at once in the morning, keep Mestinon 60 mg 3 times a day,  His myasthenia gravis overall is under good control, has mild bilateral extraocular muscle weakness, eye closure, Netzer puff weakness, mild neck flexion, upper and lower extremity proximal muscle weakness,  He will let us know the decision about steroid sparing agent,   Right lumbar radicular pain  Under the care of orthopedic surgeon Dr. 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 Laboratory evaluation in 2022, normal CMP, CBC, HIV, TSH, A1c, lipid  panel, negative acetylcholine binding antibody,  HISTORICAL  Russell Green is a 51 year old male, seen in request by his primary care physician Dr. Scarlette Calico for evaluation of myasthenia gravis, initial evaluation was on July 06, 2020  I reviewed and summarized the referring note.  He began to develop symptoms around 2010, described difficulty chewing, significant weight loss, double vision, voice change, eventually went into respiratory failure about 18 months after his symptom onset, while he was incarcerated in June 2012, he was [facility] at [facility], required prolonged intubation, tracheostomy, was diagnosed with myasthenia gravis, he was under the care of Dr. Alysia Penna team, I have limited access to 2012 initial evaluation, per patient, his symptoms began to gradually improve following 1 round of plasma exchange  He was put on high-dose of prednisone 60 mg along with Mestinon 60 mg 3 times daily, there was discussion of long-term steroid sparing agent such as CellCept and Imuran, he worried about the side effect, also because of his social situation, he has been kept on prednisone since it started in 2012  Per patient, over the years, attempt to lower dose of prednisone below 30 mg, or lower Mestinon dose below 30 mg 3 times daily, he would have recurrent symptoms such as double vision, chewing difficulty, increased fatigue, lack of stamina, limb muscle weakness  Last visit with Dr. Nadara Mustard was in January 2019, reported suboptimal control of his myasthenia symptoms, exertional fatigue, limit his ability to perform tasks around [place], climb stairs, daily double vision, while taking prednisone 30 mg daily, Mestinon 60  mg 3 times a day, Multiple spirometric trials are performed. On best effort the following are obtained: FVC: 3.21L, FEV1: 2.50L, FEV1%: 78%. The interpretation is of moderate restriction.   Following with Dr. Lyman Speller retirement, he was followed by Dr.Anahit  Cheluskin Mehrabyan, most recent visit was in November 2020  [place] was performed on the left Extensor Digitorum in 2019. The study was performed 72 hours following his last dose of pyridostigmine and while taking daily prednisone. Seventeen fiber pairs were examined and the MCD was 26.9 microSec. The Median MCD was 27.8 microSec. Normal jitter was seen in 17 of 17 (100%) pairs, increased jitter without impulse blocking was seen in 0 of 17 (0%) pairs, and impulse blocking was not observed (0%).    Single Fiber EMG recordings demonstrate increased MCD and percent fiberpairs with increased jitter and blocking in the EDC muscle only.  The Frontalis muscle is normal.   The conclusion was, these electrodiagnostic findings continue to demonstrate anabnormality of neuromuscular transmission in the EDC muscle.  Quantitatively there has been slight worsening in the EDC muscle while the Frontalis muscleis not changed.      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, assistant patient, with repetitive movement, he continues to complain shortness of breath, exertion, muscle weakness, at the end of the day, usually developed double vision especially looking to the left side, denies chewing difficulty, but try to avoid tough meat,  Laboratory evaluations in 2022, lipid panel, elevated LDL 109, cholesterol 218, PSA 1.1, normal TSH 2.0, negative HIV, hepatitis A, B, C, hepatitis B surface antigen was nonreactive, antibody was more than 1 solvent, A1c was 6.3, normal liver functional test, creatinine 1.28, hemoglobin 15,  Per record, previous multiple CT chest that showed no thymus pathology  UPDATE Feb 17 2021: He has continued on prednisone 30 mg daily, previous discussion about steroid sparing agent, such as CellCept, Imuran, he worries about the potential side effect, also continued on Mestinon 60 mg 3 times a  day, last dose was before bedtime, he denies difficulty sleeping, sleeps on 1 pillow, no shortness of breath during sleep, but has frequent snoring  He continue complains of intermittent weakness, fatigue, he is patient aid, sometimes he has difficulty helping patient, at the end of the day he often has intermittent double vision, extreme fatigue, Denies swallowing difficulty, but complains of frequent shortness of breath, sometimes he felt like difficulty to get air in  UPDATE Mar 05 2022: He has lost follow up since Oct 2022, now on mestion '60mg'$  bid, instead of tid, he noticed blurry vision, double vision, no chewing swallowing difficulty, occasionally, he felt mild SOB after heavy lifting, now prednisone '20mg'$  daily, since August 2023, was on prednsione '20mg'$ /'10mg'$ s.  Laminectomy and partial L4 for removal of intraspinal extradural facet cyst causing lumbar spinal stenosis, by Dr. Rodell Perna  on January 15, 2022 for severe right lumbar radicular pain,   He walks on treadmill 2 miles every other day.  I personally reviewed MRI lumbar on December 12 2021.  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 In addition there is moderate foraminal encroachment bilaterally due to spurring at L4-5.  Laboratory evaluations, October 2022, negative acetylcholine panel, negative TB skin test, hepatitis ABC, HIV, MuSK antibody,  A1c January 03, 2022 6.7, normal CBC, BMP with exception of elevated creatinine 1.48   PHYSICAL EXAM   Vitals:   03/05/22 1308  Weight: 256 lb (116.1 kg)  Height: '6\' 1"'$  (1.854 m)      Body mass index is 34.17 kg/m.  PHYSICAL EXAMNIATION:  Gen: NAD, conversant, well nourised, well groomed                     Cardiovascular: Regular rate rhythm, no peripheral edema, warm, nontender. Eyes: Conjunctivae clear without exudates or  hemorrhage Neck: Supple, no carotid bruits. Pulmonary: Clear to auscultation bilaterally   NEUROLOGICAL EXAM:  MENTAL STATUS: Speech/cognition: Awake, alert, oriented to history taking care of conversation   CRANIAL NERVES: CN II: Visual fields are full to confrontation. Pupils are round equal and briskly reactive to light. CN III, IV, VI: extraocular movement are normal. No ptosis.  Cover and uncover testing/Red lens testing showed right super/exotropia, left inferior/exotropia, weakness of bilateral lateral rectus muscle CN V: Facial sensation is intact to light touch CN VII: Mild eye closure, Chrystal puff weakness CN VIII: Hearing is normal to causal conversation. CN IX, X: Phonation is normal. CN XI: Head turning and shoulder shrug are intact  MOTOR: Mild neck flexion, shoulder abduction weakness  REFLEXES: Reflexes are 2+ and symmetric at the biceps, triceps, knees, and ankles. Plantar responses are flexor.  SENSORY: Intact to light touch, pinprick and vibratory sensation are intact in fingers and toes.  COORDINATION: There is no trunk or limb dysmetria noted.  GAIT/STANCE: He can get up from seated without assistant, but on the third attempt, he required multiple attempts, steady gait  REVIEW OF SYSTEMS: Full 14 system review of systems performed and notable only for as above All other review of systems were negative.  ALLERGIES: Allergies  Allergen Reactions   Flexeril [Cyclobenzaprine] Other (See Comments)    Restlessness    HOME MEDICATIONS: Current Outpatient Medications  Medication Sig Dispense Refill   CALCIUM-VITAMIN D PO Take 1 tablet by mouth daily.     methocarbamol (ROBAXIN) 500 MG tablet Take 1 tablet (500 mg total) by mouth every 6 (six) hours as needed for muscle spasms. 50 tablet 0   multivitamin-lutein (OCUVITE-LUTEIN) CAPS capsule Take 1 capsule by mouth daily.     omeprazole (PRILOSEC) 20 MG capsule Take 20 mg by mouth daily.      oxyCODONE-acetaminophen (PERCOCET) 10-325 MG tablet Take 1 tablet by mouth every 6 (six) hours as needed for pain.     predniSONE (DELTASONE) 10 MG tablet Take 2 tablets (20 mg total) by mouth 2 (two) times daily with a meal. 60 tablet 2   pyridostigmine (MESTINON) 60 MG tablet Take 1 tablet (60 mg total) by mouth in the morning and at bedtime. 180 tablet 1   No current facility-administered medications for this visit.    PAST MEDICAL HISTORY: Past Medical History:  Diagnosis Date   Arthritis    Asthma    childhood issue   Atrial fibrillation, transient ([place])    aflutter 10/2010   Dysrhythmia    History of prediabetes    Myasthenia gravis ([employer]) 12/2010   Pre-diabetes    per pt report   Thyroid disease    hyperthyroidism, s/p RAI    PAST SURGICAL HISTORY: Past Surgical History:  Procedure Laterality Date   A-FLUTTER ABLATION     LUMBAR LAMINECTOMY/DECOMPRESSION MICRODISCECTOMY N/A 01/15/2022   Procedure: L4-5 LUMBAR DECOMPRESSION, REMOVAL OF L4-5 FACET CYST;  Surgeon: Rodell Perna  C, MD;  Location: [facility];  Service: Orthopedics;  Laterality: N/A;   TRACHEAL SURGERY  2012   WISDOM TOOTH EXTRACTION      FAMILY HISTORY: Family History  Problem Relation Age of Onset   Cancer Mother        unsure of type   Other Father        unsure of medical history   Healthy Sister    Healthy Brother     SOCIAL HISTORY: Social History   Socioeconomic History   Marital status: Married    Spouse name: Not on file   Number of children: 0   Years of education: GED   Highest education level: Not on file  Occupational History   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   Right-handed.   Three cups caffeine per day.   Social Determinants of Health   Financial Resource Strain: Not on file  Food Insecurity: Not on file  Transportation Needs: Not on file  Physical Activity: Not on file  Stress: Not on file  Social Connections: Not on file  Intimate Partner Violence: Not on file      Marcial Pacas, M.D. Ph.D.  [facility] [address] [place], [place] [address] Ph: [phone] Fax: [phone]  CC:  Janith Lima, MD [address] [place],  [place] [address]

## 2022-03-05 NOTE — Patient Instructions (Signed)
Imuran Cellcept

## 2022-03-05 NOTE — Telephone Encounter (Signed)
Patient called the office requesting a call back.  I spoke with him and he said that he had a conversation with Dr. Cain Sieve a few months ago about doing colonoscopy.  He states that he did an at home test that was negative but had not had a colonoscopy.  He has thought about it and has decided that he would like to go forward with a colonoscopy.  I will put in a referral to GI for him for colonoscopy.  No further work-up at this time.  Patient expressed understanding and is in agreement with plan.

## 2022-03-05 NOTE — Telephone Encounter (Signed)
Pt is requesting a call back .Russell Green He stated that him and his PCP had a convo  about a referral  to  for his colonoscopy  and he has made his discission  and would like to have it done  after thinking about it

## 2022-03-07 ENCOUNTER — Encounter: Payer: Self-pay | Admitting: Internal Medicine

## 2022-03-07 ENCOUNTER — Telehealth: Payer: Self-pay | Admitting: Neurology

## 2022-03-07 DIAGNOSIS — G7000 Myasthenia gravis without (acute) exacerbation: Secondary | ICD-10-CM

## 2022-03-07 MED ORDER — PYRIDOSTIGMINE BROMIDE 60 MG PO TABS: 60.0000 mg | ORAL_TABLET | Freq: Three times a day (TID) | ORAL | 1 refills | Status: DC

## 2022-03-07 NOTE — Telephone Encounter (Signed)
Rx updated to reflect office visit note on 03/05/2022; "keep Mestinon 60 mg 3 times a day".

## 2022-03-07 NOTE — Telephone Encounter (Signed)
Pt is calling. Stated medication pyridostigmine (MESTINON) 60 MG tablet is supposed to be three times a day. Pt is requesting his medication is adjusted and sent back to pharmacy. Pt is requesting a call back from nurse

## 2022-03-08 NOTE — Telephone Encounter (Signed)
I left a VM to inform the patient of the rx update.

## 2022-03-13 ENCOUNTER — Ambulatory Visit (AMBULATORY_SURGERY_CENTER): Payer: Self-pay

## 2022-03-13 VITALS — Ht 73.0 in | Wt 252.0 lb

## 2022-03-13 DIAGNOSIS — Z1211 Encounter for screening for malignant neoplasm of colon: Secondary | ICD-10-CM

## 2022-03-13 MED ORDER — NA SULFATE-K SULFATE-MG SULF 17.5-3.13-1.6 GM/177ML PO SOLN: 1.0000 | Freq: Once | ORAL | 0 refills | Status: AC

## 2022-03-13 NOTE — Progress Notes (Signed)

## 2022-03-18 IMAGING — CT CT HEART MORP W/ CTA COR W/ SCORE W/ CA W/CM &/OR W/O CM
4 of 7 series · 8 of 20 positions shown, 9 images · IV contrast (omnipaque)
Comparison: None.
COMPARISON: None.

Addendum:
EXAM:
OVER-READ INTERPRETATION  CT CHEST

The following report is an over-read performed by radiologist Dr.
Findelis Pallais [[email or web address]] on 07/20/2020. This
over-read does not include interpretation of cardiac or coronary
anatomy or pathology. The coronary calcium score/coronary CTA
interpretation by the cardiologist is attached.
HISTORY: Chest pain/anginal equiv, ECGs and troponins normal Dyspnea on
exertion ([place])
Cardiac/Coronary  CT
TECHNIQUE: The patient was scanned on a Siemens Force scanner.
PROTOCOL: A 120 kV prospective scan was triggered in the descending thoracic
aorta at 111 HU's. Axial non-contrast 3 mm slices were carried out
through the heart. The data set was analyzed on a dedicated work
station and scored using the Agatson method. Gantry rotation speed
was 250 msecs and collimation was .6 mm. No IV beta blockade but
mg of sl NTG was given. The 3D data set was reconstructed in 5%
intervals of the 67-82 % of the R-R cycle. Diastolic phases were
analyzed on a dedicated work station using MPR, MIP and VRT modes.
The patient received 80mL OMNIPAQUE IOHEXOL 350 MG/ML SOLN of
contrast.

[Series 6: best diast 73 % · axial · 0.44mm/px · z∈[+1005,+1052]mm · 2 of 351 slices shown, 3 images]
[im 117/351  vessel]
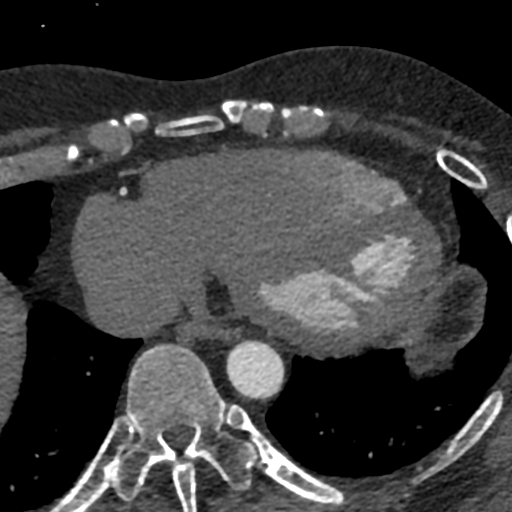
[im 117/351  lung]
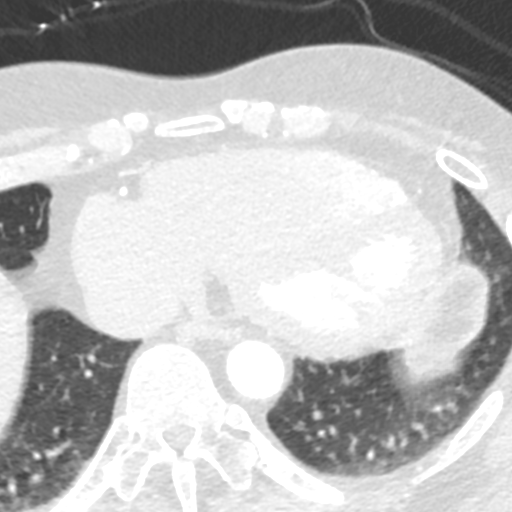
[im 234/351  vessel]
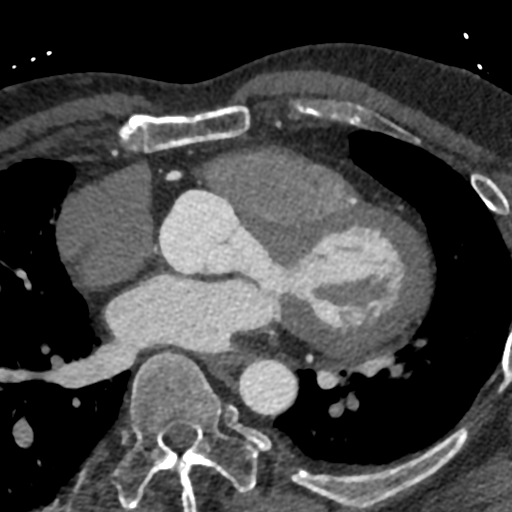

[Series 7: best syst 32 % · axial · 0.44mm/px · z∈[+1005,+1052]mm · 2 of 351 slices shown]
[im 117/351  vessel]
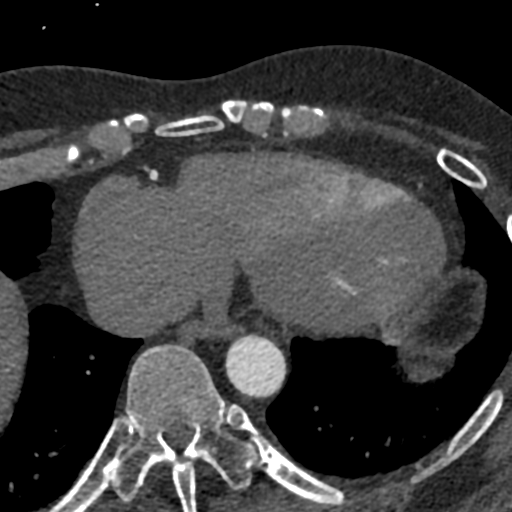
[im 234/351  vessel]
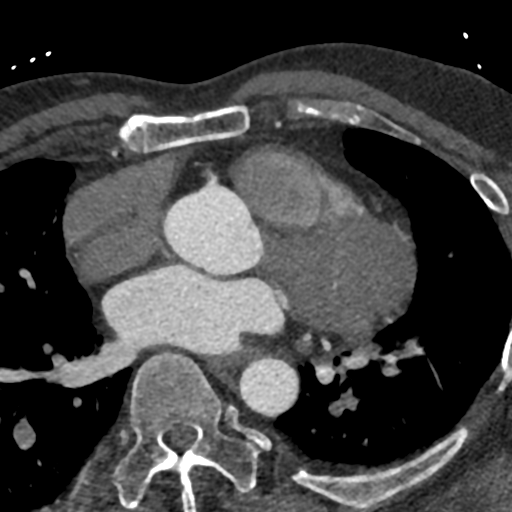

[Series 8: ts diast sharp 73 % · axial · 0.44mm/px · z∈[+1005,+1052]mm · 2 of 351 slices shown]
[im 117/351  lung]
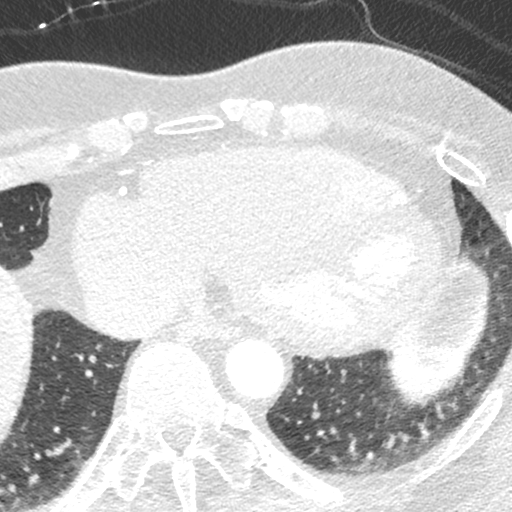
[im 234/351  lung]
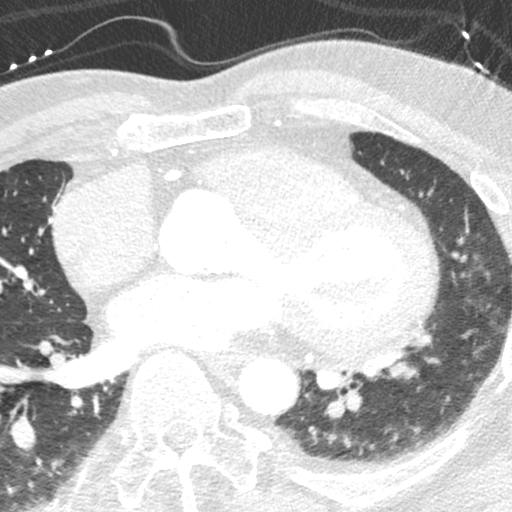

[Series 9: ts syst sharp 32 % · axial · 0.44mm/px · z∈[+1005,+1052]mm · 2 of 351 slices shown]
[im 117/351  lung]
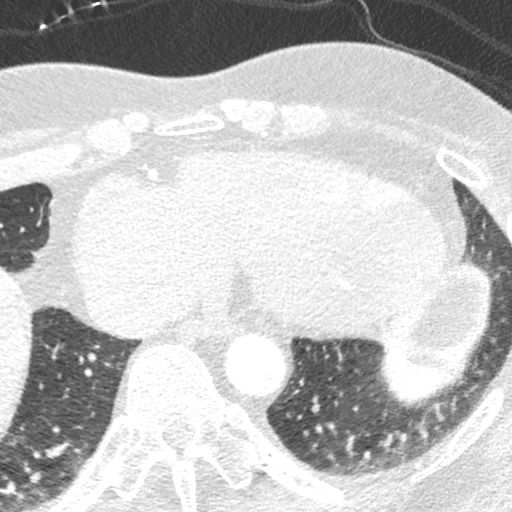
[im 234/351  lung]
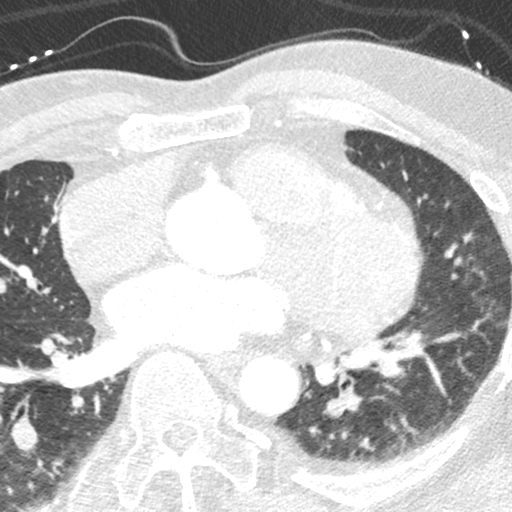

[8 of 20 positions shown; findings below may reference images not displayed]

FINDINGS: Within the visualized portions of the thorax there are no suspicious
appearing pulmonary nodules or masses, there is no acute
consolidative airspace disease, no pleural effusions, no
pneumothorax and no lymphadenopathy. Visualized portions of the
upper abdomen are unremarkable. There are no aggressive appearing
lytic or blastic lesions noted in the visualized portions of the
skeleton.
IMPRESSION: 1. No significant incidental noncardiac findings are noted.
FINDINGS: Image quality: Average.

Noise artifact is: Limited.

Coronary artery calcification score: Total coronary calcium score of
0.

Coronary arteries: Normal coronary origins.  Right dominance.

Left Main Coronary Artery:The left main is normal caliber vessel
arises from the left coronary cusp and bifurcates into left anterior
descending artery and left circumflex artery. There is no plaque or
stenosis.

Left Anterior Descending Coronary Artery: Normal caliber vessel
which reaches the apex and gives rise to 1 diagonal branch. The LAD
is overall patent without evidence of plaque or stenosis. First
diagonal branch has superior and inferior branches the vessel is
overall patent without evidence of plaque or stenosis.

Left Circumflex Artery: The left circumflex artery is normal in
caliber and travels within the AV groove and gives rise to 1 obtuse
marginal branch. Mild stenosis (25-49%) in the mid to distal LCX due
to noncalcified plaque. First obtuse marginal branch is patent
without evidence of plaque or stenosis.

Right Coronary Artery: The RCA is dominant with normal take off from
the right coronary cusp. The RCA terminates as a PDA and right
posterolateral branch without evidence of plaque or stenosis.

Left Atrium: Grossly normal in size with no left atrial appendage
filling defect.

Left Ventricle: Grossly normal in size. There are no stigmata of
prior infarction. There is no abnormal filling defect.

Pulmonary arteries: Normal in size without proximal filling defect.

Pulmonary veins: Normal pulmonary venous drainage.

Aorta: Normal size, 33 mm at the mid ascending aorta (level of the
PA bifurcation) measured double oblique. No calcifications. No
dissection.

Pericardium: Normal thickness with no significant effusion or
calcium present.

Cardiac valves: The aortic valve is trileaflet without
calcification. The mitral valve is normal structure without
calcification.

Extra-cardiac findings: See attached radiology report for
non-cardiac structures.
IMPRESSION: 1. Total coronary calcium score of 0.

2. Normal coronary origin with right dominance.

3. CAD-RADS = 2. Mild stenosis (25-49%) in the mid to distal LCX due
to noncalcified plaque.

RECOMMENDATIONS:

Consider non-atherosclerotic causes of chest pain. Consider
preventive therapy and risk factor modification.

*** End of Addendum ***
EXAM:
OVER-READ INTERPRETATION  CT CHEST

The following report is an over-read performed by radiologist Dr.
Findelis Pallais [REDACTED] on 07/20/2020. This
over-read does not include interpretation of cardiac or coronary
anatomy or pathology. The coronary calcium score/coronary CTA
interpretation by the cardiologist is attached.
FINDINGS: Within the visualized portions of the thorax there are no suspicious
appearing pulmonary nodules or masses, there is no acute
consolidative airspace disease, no pleural effusions, no
pneumothorax and no lymphadenopathy. Visualized portions of the
upper abdomen are unremarkable. There are no aggressive appearing
lytic or blastic lesions noted in the visualized portions of the
skeleton.
IMPRESSION: 1. No significant incidental noncardiac findings are noted.

## 2022-03-19 ENCOUNTER — Ambulatory Visit: Payer: 59 | Admitting: Neurology

## 2022-03-23 ENCOUNTER — Encounter: Payer: Self-pay | Admitting: Internal Medicine

## 2022-04-02 ENCOUNTER — Ambulatory Visit (AMBULATORY_SURGERY_CENTER): Payer: 59 | Admitting: Internal Medicine

## 2022-04-02 ENCOUNTER — Encounter: Payer: Self-pay | Admitting: Internal Medicine

## 2022-04-02 VITALS — BP 115/70 | HR 68 | Temp 98.6°F | Resp 17 | Ht 73.0 in | Wt 252.0 lb

## 2022-04-02 DIAGNOSIS — Z1211 Encounter for screening for malignant neoplasm of colon: Secondary | ICD-10-CM

## 2022-04-02 DIAGNOSIS — D122 Benign neoplasm of ascending colon: Secondary | ICD-10-CM

## 2022-04-02 MED ORDER — SODIUM CHLORIDE 0.9 % IV SOLN: 500.0000 mL | Freq: Once | INTRAVENOUS | Status: DC

## 2022-04-02 NOTE — Op Note (Signed)
[facility] Patient Name: Russell Green Procedure Date: 04/02/2022 4:19 PM MRN: [id number] Endoscopist: Docia Chuck. Henrene Pastor , MD, [id number] Age: 51 Referring MD:  Date of Birth: [birth year] Gender: Male Account #: [id number] Procedure:                Colonoscopy with cold snare polypectomy x 1 Indications:              Screening for colorectal malignant neoplasm Medicines:                Monitored Anesthesia Care Procedure:                Pre-Anesthesia Assessment:                           - Prior to the procedure, a History and Physical                            was performed, and patient medications and                            allergies were reviewed. The patient's tolerance of                            previous anesthesia was also reviewed. The risks                            and benefits of the procedure and the sedation                            options and risks were discussed with the patient.                            All questions were answered, and informed consent                            was obtained. Prior Anticoagulants: The patient has                            taken no anticoagulant or antiplatelet agents. ASA                            Grade Assessment: II - A patient with mild systemic                            disease. After reviewing the risks and benefits,                            the patient was deemed in satisfactory condition to                            undergo the procedure.                           After obtaining informed consent, the colonoscope  was passed under direct vision. Throughout the                            procedure, the patient's blood pressure, pulse, and                            oxygen saturations were monitored continuously. The                            CF [id number] #[id number] was introduced through the anus                            and advanced to the the cecum, identified by                             appendiceal orifice and ileocecal valve. The                            ileocecal valve, appendiceal orifice, and rectum                            were photographed. The quality of the bowel                            preparation was excellent. The colonoscopy was                            performed without difficulty. The patient tolerated                            the procedure well. The bowel preparation used was                            SUPREP via split dose instruction. Scope In: 4:28:32 PM Scope Out: 4:49:18 PM Scope Withdrawal Time: 0 hours 9 minutes 48 seconds  Total Procedure Duration: 0 hours 20 minutes 46 seconds  Findings:                 A 3 mm polyp was found in the ascending colon. The                            polyp was removed with a cold snare. Resection and                            retrieval were complete.                           The exam was otherwise without abnormality on                            direct and retroflexion views. Complications:            No immediate complications. Estimated blood loss:  None. Estimated Blood Loss:     Estimated blood loss: none. Impression:               - One 3 mm polyp in the ascending colon, removed                            with a cold snare. Resected and retrieved.                           - The examination was otherwise normal on direct                            and retroflexion views. Recommendation:           - Repeat colonoscopy in 7-10 years for surveillance.                           - Patient has a contact number available for                            emergencies. The signs and symptoms of potential                            delayed complications were discussed with the                            patient. Return to normal activities tomorrow.                            Written discharge instructions were provided to the                            patient.                            - Resume previous diet.                           - Continue present medications.                           - Await pathology results. Docia Chuck. Henrene Pastor, MD 04/02/2022 4:54:56 PM This report has been signed electronically.

## 2022-04-02 NOTE — Progress Notes (Signed)
Sedate, gd SR, tolerated procedure well, VSS, report to RN 

## 2022-04-02 NOTE — Progress Notes (Signed)
Called to room to assist during endoscopic procedure.  Patient ID and intended procedure confirmed with present staff. Received instructions for my participation in the procedure from the performing physician.  

## 2022-04-02 NOTE — Progress Notes (Signed)
Pt's states no medical or surgical changes since previsit or office visit. 

## 2022-04-02 NOTE — Progress Notes (Signed)
HISTORY OF PRESENT ILLNESS:  Russell Green is a 51 y.o. male who is sent today for routine screening colonoscopy.  No complaints  REVIEW OF SYSTEMS:  All non-GI ROS negative. Past Medical History:  Diagnosis Date   Arthritis    Asthma    childhood issue   Atrial fibrillation, transient ([place])    aflutter 10/2010   Dysrhythmia    History of prediabetes    Myasthenia gravis ([place]) 12/2010   Pre-diabetes    per pt report   Thyroid disease    hyperthyroidism, s/p RAI    Past Surgical History:  Procedure Laterality Date   A-FLUTTER ABLATION     LUMBAR LAMINECTOMY/DECOMPRESSION MICRODISCECTOMY N/A 01/15/2022   Procedure: L4-5 LUMBAR DECOMPRESSION, REMOVAL OF L4-5 FACET CYST;  Surgeon: [name], MD;  Location: [facility];  Service: Orthopedics;  Laterality: N/A;   TRACHEAL SURGERY  2012   WISDOM TOOTH EXTRACTION      Social History Russell Green  reports that he quit smoking about 28 years ago. His smoking use included cigarettes. He has a 2.50 pack-year smoking history. He has never used smokeless tobacco. He reports that he does not drink alcohol and does not use drugs.  family history includes Cancer in his mother; Healthy in his brother and sister; Other in his father.  Allergies  Allergen Reactions   Flexeril [Cyclobenzaprine] Other (See Comments)    Restlessness       PHYSICAL EXAMINATION: Vital signs: BP 137/71   Pulse 79   Temp 98.6 F (37 C)   Ht '6\' 1"'$  (1.854 m)   Wt 252 lb (114.3 kg)   SpO2 98%   BMI 33.25 kg/m  General: Well-developed, well-nourished, no acute distress HEENT: Sclerae are anicteric, conjunctiva pink. Oral mucosa intact Lungs: Clear Heart: Regular Abdomen: soft, nontender, nondistended, no obvious ascites, no peritoneal signs, normal bowel sounds. No organomegaly. Extremities: No edema Psychiatric: alert and oriented x3. Cooperative     ASSESSMENT:  Colon cancer screening   PLAN:  Screening colonoscopy

## 2022-04-02 NOTE — Patient Instructions (Signed)
YOU HAD AN ENDOSCOPIC PROCEDURE TODAY AT THE [facility]:   Refer to the procedure report that was given to you for any specific questions about what was found during the examination.  If the procedure report does not answer your questions, please call your gastroenterologist to clarify.  If you requested that your care partner not be given the details of your procedure findings, then the procedure report has been included in a sealed envelope for you to review at your convenience later.  YOU SHOULD EXPECT: Some feelings of bloating in the abdomen. Passage of more gas than usual.  Walking can help get rid of the air that was put into your GI tract during the procedure and reduce the bloating. If you had a lower endoscopy (such as a colonoscopy or flexible sigmoidoscopy) you may notice spotting of blood in your stool or on the toilet paper. If you underwent a bowel prep for your procedure, you may not have a normal bowel movement for a few days.  Please Note:  You might notice some irritation and congestion in your nose or some drainage.  This is from the oxygen used during your procedure.  There is no need for concern and it should clear up in a day or so.  SYMPTOMS TO REPORT IMMEDIATELY:  Following lower endoscopy (colonoscopy or flexible sigmoidoscopy):  Excessive amounts of blood in the stool  Significant tenderness or worsening of abdominal pains  Swelling of the abdomen that is new, acute  Fever of 100F or higher  For urgent or emergent issues, a gastroenterologist can be reached at any hour by calling [phone]. Do not use MyChart messaging for urgent concerns.    DIET:  We do recommend a small meal at first, but then you may proceed to your regular diet.  Drink plenty of fluids but you should avoid alcoholic beverages for 24 hours.  ACTIVITY:  You should plan to take it easy for the rest of today and you should NOT DRIVE or use heavy machinery until tomorrow (because of  the sedation medicines used during the test).    FOLLOW UP: Our staff will call the number listed on your records the next business day following your procedure.  We will call around 7:15- 8:00 am to check on you and address any questions or concerns that you may have regarding the information given to you following your procedure. If we do not reach you, we will leave a message.     If any biopsies were taken you will be contacted by phone or by letter within the next 1-3 weeks.  Please call us at [phone] if you have not heard about the biopsies in 3 weeks.    SIGNATURES/CONFIDENTIALITY: You and/or your care partner have signed paperwork which will be entered into your electronic medical record.  These signatures attest to the fact that that the information above on your After Visit Summary has been reviewed and is understood.  Full responsibility of the confidentiality of this discharge information lies with you and/or your care-partner.  

## 2022-04-03 ENCOUNTER — Telehealth: Payer: Self-pay | Admitting: *Deleted

## 2022-04-03 NOTE — Telephone Encounter (Signed)
  Follow up Call-     04/02/2022    2:50 PM  Call back number  Post procedure Call Back phone  # [phone]  Permission to leave phone message Yes     Patient questions:  Do you have a fever, pain , or abdominal swelling? No. Pain Score  0 *  Have you tolerated food without any problems? Yes.    Have you been able to return to your normal activities? Yes.    Do you have any questions about your discharge instructions: Diet   No. Medications  No. Follow up visit  No.  Do you have questions or concerns about your Care? No.  Actions: * If pain score is 4 or above: No action needed, pain <4.

## 2022-04-09 ENCOUNTER — Encounter: Payer: Self-pay | Admitting: Internal Medicine

## 2022-04-19 NOTE — Progress Notes (Signed)
CC: 6 month checkup  HPI:  Mr.Russell Green is a 51 y.o. male living with a history stated below and presents today for a routine follow-up of his chronic medical conditions. Please see problem based assessment and plan for additional details.  Past Medical History:  Diagnosis Date   Arthritis    Asthma    childhood issue   Atrial fibrillation, transient ([place])    aflutter 10/2010   Dysrhythmia    History of prediabetes    Myasthenia gravis ([place]) 12/2010   Pre-diabetes    per pt report   Thyroid disease    hyperthyroidism, s/p RAI    Current Outpatient Medications on File Prior to Visit  Medication Sig Dispense Refill   CALCIUM-VITAMIN D PO Take 1 tablet by mouth daily.     methocarbamol (ROBAXIN) 500 MG tablet Take 1 tablet (500 mg total) by mouth every 6 (six) hours as needed for muscle spasms. 50 tablet 0   multivitamin-lutein (OCUVITE-LUTEIN) CAPS capsule Take 1 capsule by mouth daily. (Patient not taking: Reported on 03/13/2022)     omeprazole (PRILOSEC) 20 MG capsule Take 20 mg by mouth daily.     oxyCODONE-acetaminophen (PERCOCET) 10-325 MG tablet Take 1 tablet by mouth every 6 (six) hours as needed for pain.     predniSONE (DELTASONE) 10 MG tablet Take 3 tablets (30 mg total) by mouth 2 (two) times daily with a meal. 90 tablet 11   pyridostigmine (MESTINON) 60 MG tablet Take 1 tablet (60 mg total) by mouth 3 (three) times daily. 270 tablet 1   No current facility-administered medications on file prior to visit.    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   Right-handed.   Three cups caffeine per day.   Social Determinants of Health   Financial Resource Strain: Not on file  Food Insecurity: Not on file  Transportation Needs: Not on file  Physical Activity: Not on file  Stress: Not on file  Social Connections: Not on file  Intimate Partner Violence: Not on file    Review of Systems: ROS negative except for what is noted on the assessment and plan.  Vitals:   04/20/22 0903  BP: 127/85  Pulse: 94  SpO2: 100%  Weight: 250 lb 12.8 oz (113.8 kg)  Height: '6\' 1"'$  (1.854 m)    Physical Exam: Constitutional: well-appearing male sitting  in chair, in no acute distress Cardiovascular: regular rate and rhythm, no m/r/g Pulmonary/Chest: normal work of breathing on room air MSK: normal bulk and tone Neurological: alert & oriented x 3, no focal deficit Skin: warm and dry Psych: normal mood and behavior  Assessment & Plan:   Patient discussed with Dr. Cain Sieve  Prediabetes A1c is 6.4% today.  The patient is aware of his diagnosis of prediabetes and that it is likely been caused by his chronic prednisone use for his myasthenia gravis.  The patient continues to maintain a healthy diet and exercises regularly, stating that he walks on the treadmill about 2.5 to 3 miles a few days a week.  Plan: -Repeat A1c in 6 months  Myasthenia gravis without exacerbation ([place]) The patient was diagnosed with myasthenia gravis around 20012/2013.  At that time he had significant bulbar, ocular, limb muscle weakness, and respiratory failure requiring  prolonged intubation with tracheostomy.  He has been following with neurology and has been doing well on prednisone 10 mg daily and pyridostigmine 60 mg 3 times daily.  He has no symptoms of weakness, fatigue, palpitations, or vision changes.  Erectile dysfunction The patient states that he has been difficulties with erectile dysfunction over the past 2 months.  He states that it is difficult for him to have and to maintain an erection, but when he does he has no issues ejaculating.  He states that his libido is normal.  Patient has no urinary symptoms and no penile discharge.  His PCP did a full exam on him at the last office visit, and he has no evidence of Peyronie's disease.  Additionally, the patient denies any tobacco use, alcohol use, history of hypertension, depression, anxiety, or thyroid issues.  He also denies any weight gain/loss, heat/cold intolerance, diarrhea or constipation.  Plan: -Start sildenafil 50 mg as needed (could increase dose if needed)   Manika Hast, D.O. [facility], PGY-2 Phone: [phone] Date 04/20/2022 Time 10:26 AM

## 2022-04-20 ENCOUNTER — Ambulatory Visit (INDEPENDENT_AMBULATORY_CARE_PROVIDER_SITE_OTHER): Payer: 59 | Admitting: Internal Medicine

## 2022-04-20 ENCOUNTER — Encounter: Payer: Self-pay | Admitting: Internal Medicine

## 2022-04-20 VITALS — BP 127/85 | HR 94 | Ht 73.0 in | Wt 250.8 lb

## 2022-04-20 DIAGNOSIS — R7303 Prediabetes: Secondary | ICD-10-CM | POA: Diagnosis not present

## 2022-04-20 DIAGNOSIS — G7000 Myasthenia gravis without (acute) exacerbation: Secondary | ICD-10-CM

## 2022-04-20 DIAGNOSIS — N529 Male erectile dysfunction, unspecified: Secondary | ICD-10-CM | POA: Diagnosis not present

## 2022-04-20 LAB — GLUCOSE, CAPILLARY: Glucose-Capillary: 109 mg/dL — ABNORMAL HIGH (ref 70–99)

## 2022-04-20 LAB — POCT GLYCOSYLATED HEMOGLOBIN (HGB A1C): Hemoglobin A1C: 6.4 % — AB (ref 4.0–5.6)

## 2022-04-20 MED ORDER — SILDENAFIL CITRATE 50 MG PO TABS: 50.0000 mg | ORAL_TABLET | ORAL | 1 refills | Status: DC | PRN

## 2022-04-20 NOTE — Assessment & Plan Note (Signed)
The patient was diagnosed with myasthenia gravis around 20012/2013.  At that time he had significant bulbar, ocular, limb muscle weakness, and respiratory failure requiring prolonged intubation with tracheostomy.  He has been following with neurology and has been doing well on prednisone 10 mg daily and pyridostigmine 60 mg 3 times daily.  He has no symptoms of weakness, fatigue, palpitations, or vision changes.

## 2022-04-20 NOTE — Patient Instructions (Signed)
Thank you, Mr.Cable H Bogusz for allowing [place] to provide your care today. Today we discussed:  Prediabetes: Your prediabetes is likely caused by your chronic prednisone medication.  Continue to keep up with your diet and exercise, and this will be well-controlled and you will likely not need any medications  Erectile dysfunction: Take Viagra 50 mg as needed, before sexual intercourse  I have ordered the following labs for you:   Lab Orders         Glucose, capillary         POC Hbg A1C       Referrals ordered today:   Referral Orders  No referral(s) requested today     I have ordered the following medication/changed the following medications:   Stop the following medications: There are no discontinued medications.   Start the following medications: Meds ordered this encounter  Medications   sildenafil (VIAGRA) 50 MG tablet    Sig: Take 1 tablet (50 mg total) by mouth as needed for erectile dysfunction.    Dispense:  30 tablet    Refill:  1     Follow up: 6 months   Should you have any questions or concerns please call the internal medicine clinic at [phone].     Buddy Duty, D.O. [facility]

## 2022-04-20 NOTE — Assessment & Plan Note (Signed)
The patient states that he has been difficulties with erectile dysfunction over the past 2 months.  He states that it is difficult for him to have and to maintain an erection, but when he does he has no issues ejaculating.  He states that his libido is normal.  Patient has no urinary symptoms and no penile discharge.  His PCP did a full exam on him at the last office visit, and he has no evidence of Peyronie's disease.  Additionally, the patient denies any tobacco use, alcohol use, history of hypertension, depression, anxiety, or thyroid issues.  He also denies any weight gain/loss, heat/cold intolerance, diarrhea or constipation.  Plan: -Start sildenafil 50 mg as needed (could increase dose if needed)

## 2022-04-20 NOTE — Progress Notes (Signed)
Internal Medicine Clinic Attending ° °Case discussed with Dr. Atway  At the time of the visit.  We reviewed the resident’s history and exam and pertinent patient test results.  I agree with the assessment, diagnosis, and plan of care documented in the resident’s note.  °

## 2022-04-20 NOTE — Assessment & Plan Note (Signed)
A1c is 6.4% today.  The patient is aware of his diagnosis of prediabetes and that it is likely been caused by his chronic prednisone use for his myasthenia gravis.  The patient continues to maintain a healthy diet and exercises regularly, stating that he walks on the treadmill about 2.5 to 3 miles a few days a week.  Plan: -Repeat A1c in 6 months

## 2022-04-26 ENCOUNTER — Telehealth: Payer: Self-pay

## 2022-04-26 DIAGNOSIS — N529 Male erectile dysfunction, unspecified: Secondary | ICD-10-CM

## 2022-04-26 NOTE — Telephone Encounter (Signed)
Patient called he is requesting a dosage increase for Viagra. Please call the patient to discuss further.

## 2022-04-29 MED ORDER — SILDENAFIL CITRATE 100 MG PO TABS: 100.0000 mg | ORAL_TABLET | ORAL | 1 refills | Status: DC | PRN

## 2022-06-23 DIAGNOSIS — G894 Chronic pain syndrome: Secondary | ICD-10-CM | POA: Diagnosis not present

## 2022-06-23 DIAGNOSIS — M545 Low back pain, unspecified: Secondary | ICD-10-CM | POA: Diagnosis not present

## 2022-06-23 DIAGNOSIS — Z6831 Body mass index (BMI) 31.0-31.9, adult: Secondary | ICD-10-CM | POA: Diagnosis not present

## 2022-06-23 DIAGNOSIS — Z7952 Long term (current) use of systemic steroids: Secondary | ICD-10-CM | POA: Diagnosis not present

## 2022-06-23 DIAGNOSIS — R030 Elevated blood-pressure reading, without diagnosis of hypertension: Secondary | ICD-10-CM | POA: Diagnosis not present

## 2022-06-23 DIAGNOSIS — Z79891 Long term (current) use of opiate analgesic: Secondary | ICD-10-CM | POA: Diagnosis not present

## 2022-06-23 DIAGNOSIS — G7000 Myasthenia gravis without (acute) exacerbation: Secondary | ICD-10-CM | POA: Diagnosis not present

## 2022-06-23 DIAGNOSIS — E669 Obesity, unspecified: Secondary | ICD-10-CM | POA: Diagnosis not present

## 2022-06-23 DIAGNOSIS — M62838 Other muscle spasm: Secondary | ICD-10-CM | POA: Diagnosis not present

## 2022-06-23 DIAGNOSIS — Z87891 Personal history of nicotine dependence: Secondary | ICD-10-CM | POA: Diagnosis not present

## 2022-06-26 ENCOUNTER — Other Ambulatory Visit: Payer: Self-pay | Admitting: Internal Medicine

## 2022-09-05 ENCOUNTER — Ambulatory Visit: Payer: Self-pay | Admitting: Neurology

## 2022-09-05 ENCOUNTER — Encounter: Payer: Self-pay | Admitting: Neurology

## 2022-09-05 NOTE — Progress Notes (Deleted)
No chief complaint on file.   ASSESSMENT AND PLAN  Russell Green is a 52 y.o. male   Seronegative generalized myasthenia gravis  Previous significant bulbar, ocular, limb muscle weakness, required prolonged intubation, tracheostomy in 2012, was treated by [facility] Dr. Dimas Aguas  Significant improvement following plasma exchange,  CT chest that showed no thymus pathology  From patient and record, there is no evidence of using IVIG, and steroid sparing agent treatment in the past,   He still concerned about the long-term side effect of steroid sparing agent such as CellCept, Imuran, he wants to hold all dose agent right now  Has been on long-term prednisone treatment, 20 mg / 10 mg recently, still require Mestinon 60 mg up to 3 times a day for recurrent double vision,  I do think he would do better adding a steroid sparing agent to avoid a long-term side effect of prednisone, he is already diabetic, most recent A1c 6.7,  Also suggesting to prednisone 30 mg all at once in the morning, keep Mestinon 60 mg 3 times a day,  His myasthenia gravis overall is under good control, has mild bilateral extraocular muscle weakness, eye closure, Mottola puff weakness, mild neck flexion, upper and lower extremity proximal muscle weakness,  He will let us know the decision about steroid sparing agent,   Right lumbar radicular pain  Under the care of orthopedic surgeon Dr. 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 Laboratory evaluation in 2022, normal CMP, CBC, HIV, TSH, A1c, lipid panel, negative acetylcholine binding antibody,  HISTORICAL  Russell Green is a  52 year old male, seen in request by his primary care physician Dr. Sanda Linger for evaluation of myasthenia gravis, initial evaluation was on July 06, 2020  I reviewed and summarized the referring note.  He began to develop symptoms around 2010, described difficulty chewing, significant weight loss, double vision, voice change, eventually went into respiratory failure about 18 months after his symptom onset, while he was incarcerated in June 2012, he was treated at [facility], required prolonged intubation, tracheostomy, was diagnosed with myasthenia gravis, he was under the care of Dr. Dartha Lodge team, I have limited access to 2012 initial evaluation, per patient, his symptoms began to gradually improve following 1 round of plasma exchange  He was put on high-dose of prednisone 60 mg along with Mestinon 60 mg 3 times daily, there was discussion of long-term steroid sparing agent such as CellCept and Imuran, he worried about the side effect, also because of his social situation, he has been kept on prednisone since it started in 2012  Per patient, over the years, attempt to lower dose of prednisone below 30 mg, or lower Mestinon dose below 30 mg 3 times daily, he would have recurrent symptoms such as double vision, chewing difficulty, increased fatigue, lack of stamina, limb muscle weakness  Last visit with Dr. Dimas Aguas was in January 2019, reported suboptimal control of his myasthenia symptoms, exertional fatigue, limit his ability to perform tasks around [place], climb stairs, daily double vision, while taking prednisone 30 mg daily, Mestinon 60 mg 3 times a day, Multiple spirometric trials are performed. On best effort the following are obtained: FVC:  3.21L, FEV1: 2.50L, FEV1%: 78%. The interpretation is of moderate restriction.   Following with Dr. Jeannette How retirement, he was followed by Dr.Anahit Cheluskin Mehrabyan, most recent visit was in November 2020  SINGLE FIBER ELECTROMYOGRAPHY  was performed on the left Extensor Digitorum in 2019. The study was performed 72 hours following his last dose of pyridostigmine and while taking daily prednisone. Seventeen fiber pairs were examined and the MCD was 26.9 microSec. The Median MCD was 27.8 microSec. Normal jitter was seen in 17 of 17 (100%) pairs, increased jitter without impulse blocking was seen in 0 of 17 (0%) pairs, and impulse blocking was not observed (0%).    Single Fiber EMG recordings demonstrate increased MCD and percent fiberpairs with increased jitter and blocking in the EDC muscle only.  The Frontalis muscle is normal.   The conclusion was, these electrodiagnostic findings continue to demonstrate anabnormality of neuromuscular transmission in the EDC muscle.  Quantitatively there has been slight worsening in the EDC muscle while the Frontalis muscleis not changed.      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, assistant patient, with repetitive movement, he continues to complain shortness of breath, exertion, muscle weakness, at the end of the day, usually developed double vision especially looking to the left side, denies chewing difficulty, but try to avoid tough meat,  Laboratory evaluations in 2022, lipid panel, elevated LDL 109, cholesterol 218, PSA 1.1, normal TSH 2.0, negative HIV, hepatitis A, B, C, hepatitis B surface antigen was nonreactive, antibody was more than 1 solvent, A1c was 6.3, normal liver functional test, creatinine 1.28, hemoglobin 15,  Per record, previous multiple CT chest that showed no thymus pathology  UPDATE Feb 17 2021: He has continued on prednisone 30 mg daily, previous discussion about steroid sparing agent, such as CellCept, Imuran, he worries about the potential side effect, also continued on Mestinon 60 mg 3 times a day, last dose was before bedtime, he denies difficulty sleeping, sleeps on 1 pillow, no  shortness of breath during sleep, but has frequent snoring  He continue complains of intermittent weakness, fatigue, he is patient aid, sometimes he has difficulty helping patient, at the end of the day he often has intermittent double vision, extreme fatigue, Denies swallowing difficulty, but complains of frequent shortness of breath, sometimes he felt like difficulty to get air in  UPDATE Mar 05 2022: He has lost follow up since Oct 2022, now on mestion 60mg  bid, instead of tid, he noticed blurry vision, double vision, no chewing swallowing difficulty, occasionally, he felt mild SOB after heavy lifting, now prednisone 20mg  daily, since August 2023, was on prednsione 20mg /10mg s.  Laminectomy and partial L4 for removal of intraspinal extradural facet cyst causing lumbar spinal stenosis, by Dr. Annell Greening  on January 15, 2022 for severe right lumbar radicular pain,   He walks on treadmill 2 miles every other day.  I personally reviewed MRI lumbar on December 12 2021.  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 In addition there is moderate foraminal encroachment bilaterally due to spurring at L4-5.  Laboratory evaluations, October 2022, negative acetylcholine panel, negative TB skin test, hepatitis ABC, HIV, MuSK antibody,  A1c January 03, 2022 6.7, normal CBC, BMP with exception of elevated creatinine 1.48   PHYSICAL  EXAM   There were no vitals filed for this visit.     Body mass index is 34.17 kg/m.  PHYSICAL EXAMNIATION:  Gen: NAD, conversant, well nourised, well groomed                     Cardiovascular: Regular rate rhythm, no peripheral edema, warm, nontender. Eyes: Conjunctivae clear without exudates or hemorrhage Neck: Supple, no carotid bruits. Pulmonary: Clear to auscultation bilaterally   NEUROLOGICAL EXAM:  MENTAL  STATUS: Speech/cognition: Awake, alert, oriented to history taking care of conversation   CRANIAL NERVES: CN II: Visual fields are full to confrontation. Pupils are round equal and briskly reactive to light. CN III, IV, VI: extraocular movement are normal. No ptosis.  Cover and uncover testing/Red lens testing showed right super/exotropia, left inferior/exotropia, weakness of bilateral lateral rectus muscle CN V: Facial sensation is intact to light touch CN VII: Mild eye closure, Lubinski puff weakness CN VIII: Hearing is normal to causal conversation. CN IX, X: Phonation is normal. CN XI: Head turning and shoulder shrug are intact  MOTOR: Mild neck flexion, shoulder abduction weakness  REFLEXES: Reflexes are 2+ and symmetric at the biceps, triceps, knees, and ankles. Plantar responses are flexor.  SENSORY: Intact to light touch, pinprick and vibratory sensation are intact in fingers and toes.  COORDINATION: There is no trunk or limb dysmetria noted.  GAIT/STANCE: He can get up from seated without assistant, but on the third attempt, he required multiple attempts, steady gait  REVIEW OF SYSTEMS: Full 14 system review of systems performed and notable only for as above All other review of systems were negative.  ALLERGIES: Allergies  Allergen Reactions   Flexeril [Cyclobenzaprine] Other (See Comments)    Restlessness    HOME MEDICATIONS: Current Outpatient Medications  Medication Sig Dispense Refill   CALCIUM-VITAMIN D PO Take 1 tablet by mouth daily.     methocarbamol (ROBAXIN) 500 MG tablet Take 1 tablet (500 mg total) by mouth every 6 (six) hours as needed for muscle spasms. 50 tablet 0   multivitamin-lutein (OCUVITE-LUTEIN) CAPS capsule Take 1 capsule by mouth daily. (Patient not taking: Reported on 03/13/2022)     omeprazole (PRILOSEC) 20 MG capsule Take 20 mg by mouth daily.     oxyCODONE-acetaminophen (PERCOCET) 10-325 MG tablet Take 1 tablet by mouth every 6 (six) hours  as needed for pain.     predniSONE (DELTASONE) 10 MG tablet Take 3 tablets (30 mg total) by mouth 2 (two) times daily with a meal. 90 tablet 11   pyridostigmine (MESTINON) 60 MG tablet Take 1 tablet (60 mg total) by mouth 3 (three) times daily. 270 tablet 1   sildenafil (VIAGRA) 100 MG tablet TAKE 1 TABLET BY MOUTH EVERY DAY AS NEEDED FOR ERECTILE DYSFUNCTION 20 tablet 1   No current facility-administered medications for this visit.    PAST MEDICAL HISTORY: Past Medical History:  Diagnosis Date   Arthritis    Asthma    childhood issue   Atrial fibrillation, transient (HCC)    aflutter 10/2010   Dysrhythmia    History of prediabetes    Myasthenia gravis (HCC) 12/2010   Pre-diabetes    per pt report   Thyroid disease    hyperthyroidism, s/p RAI    PAST SURGICAL HISTORY: Past Surgical History:  Procedure Laterality Date   A-FLUTTER ABLATION     LUMBAR LAMINECTOMY/DECOMPRESSION MICRODISCECTOMY N/A 01/15/2022   Procedure: L4-5 LUMBAR DECOMPRESSION, REMOVAL OF L4-5 FACET CYST;  Surgeon: Eldred Manges,  MD;  Location: [facility] OR;  Service: Orthopedics;  Laterality: N/A;   TRACHEAL SURGERY  2012   WISDOM TOOTH EXTRACTION      FAMILY HISTORY: Family History  Problem Relation Age of Onset   Cancer Mother        unsure of type   Other Father        unsure of medical history   Healthy Sister    Healthy Brother     SOCIAL HISTORY: Social History   Socioeconomic History   Marital status: Married    Spouse name: Not on file   Number of children: 0   Years of education: GED   Highest education level: Not on file  Occupational History   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   Right-handed.   Three cups caffeine per day.   Social Determinants of Health   Financial Resource Strain: Not on file  Food Insecurity: Not on file  Transportation Needs: Not on file  Physical Activity: Not on file  Stress: Not on file  Social Connections: Not on file  Intimate Partner Violence: Not on file      Margie Ege, Russell Oh, DNP  [facility] [address], [address] [place], [place] [address] [phone]

## 2022-09-11 ENCOUNTER — Other Ambulatory Visit: Payer: Self-pay | Admitting: Orthopaedic Surgery

## 2022-09-13 ENCOUNTER — Other Ambulatory Visit: Payer: Self-pay | Admitting: Orthopaedic Surgery

## 2022-10-15 ENCOUNTER — Other Ambulatory Visit: Payer: Self-pay | Admitting: Neurology

## 2022-10-15 DIAGNOSIS — G7000 Myasthenia gravis without (acute) exacerbation: Secondary | ICD-10-CM

## 2022-10-30 ENCOUNTER — Other Ambulatory Visit: Payer: Self-pay

## 2022-10-30 DIAGNOSIS — G7000 Myasthenia gravis without (acute) exacerbation: Secondary | ICD-10-CM

## 2022-11-27 ENCOUNTER — Other Ambulatory Visit: Payer: Self-pay | Admitting: Neurology

## 2022-11-27 DIAGNOSIS — G7000 Myasthenia gravis without (acute) exacerbation: Secondary | ICD-10-CM

## 2022-11-28 ENCOUNTER — Telehealth: Payer: Self-pay | Admitting: Neurology

## 2022-11-28 NOTE — Telephone Encounter (Signed)
Pt called needing a refill request for his  pyridostigmine (MESTINON) 60 MG tablet sent to [facility] [address]

## 2022-11-28 NOTE — Telephone Encounter (Signed)
Phonr rm: Plz let pt know was sent earlier to: [id number], [place] - [address]  [address], [place] [address]

## 2022-11-28 NOTE — Telephone Encounter (Signed)
Called pt informed him of message [facility] sent. Pt said he will contact pharmacy now.

## 2023-03-29 ENCOUNTER — Other Ambulatory Visit: Payer: Self-pay | Admitting: Neurology

## 2023-03-29 DIAGNOSIS — G7000 Myasthenia gravis without (acute) exacerbation: Secondary | ICD-10-CM

## 2023-05-03 ENCOUNTER — Other Ambulatory Visit: Payer: Self-pay | Admitting: Internal Medicine

## 2023-05-03 ENCOUNTER — Other Ambulatory Visit: Payer: Self-pay | Admitting: Neurology

## 2023-05-03 DIAGNOSIS — G7000 Myasthenia gravis without (acute) exacerbation: Secondary | ICD-10-CM

## 2023-05-03 MED ORDER — PREDNISONE 10 MG PO TABS: 10.0000 mg | ORAL_TABLET | Freq: Every day | ORAL | 0 refills | Status: DC

## 2023-05-21 ENCOUNTER — Ambulatory Visit (INDEPENDENT_AMBULATORY_CARE_PROVIDER_SITE_OTHER): Payer: Self-pay | Admitting: Student

## 2023-05-21 VITALS — BP 127/74 | HR 76 | Temp 98.5°F | Ht 73.0 in | Wt 257.8 lb

## 2023-05-21 DIAGNOSIS — Z0000 Encounter for general adult medical examination without abnormal findings: Secondary | ICD-10-CM

## 2023-05-21 DIAGNOSIS — N529 Male erectile dysfunction, unspecified: Secondary | ICD-10-CM

## 2023-05-21 DIAGNOSIS — G7000 Myasthenia gravis without (acute) exacerbation: Secondary | ICD-10-CM

## 2023-05-21 DIAGNOSIS — R7303 Prediabetes: Secondary | ICD-10-CM

## 2023-05-21 DIAGNOSIS — Z9889 Other specified postprocedural states: Secondary | ICD-10-CM

## 2023-05-21 MED ORDER — METHOCARBAMOL 500 MG PO TABS: 500.0000 mg | ORAL_TABLET | Freq: Four times a day (QID) | ORAL | 0 refills | Status: AC | PRN

## 2023-05-21 NOTE — Assessment & Plan Note (Signed)
 Patient with history of lumbar spinal surgery with decompression of spinal cord for large synovial cyst on L4-5 projecting into the spinal canal with Dr. Barbarann (orthopedic surgery). Patient on chronic pain medication management including oxycodone /acetaminophen  (percocet) q6H as needed that is refilled by [facility] pain management clinic.   Patient states he has resumed walking on treadmill a few times a week since the New Year (about 2 weeks ago). Since then, he has had tightening/discomfort in his lower back as well as associated right groin pain that worsens with activity and improves with rest. Has been taking Percocet and hot showers with some relief. Denied red flag symptoms such as numbness, weakness, urinary or fecal incontinence, fevers, or pain that wakes him up from his sleep. States the pain is starting to affect the way he walks. Physical exam unremarkable, no point tenderness along spine with palpation. Discussed conservative management of symptoms with 1 month follow up. Patient is agreeable to plan, will call clinic back to schedule an appointment sooner if needed.  Plan:  - Robaxin  500 mg q6H  - Stretching prior to exercising - Warm compresses - OTC lidocaine  patches - 1- month follow up as needed; if pain is worse or patient fails conservative management will consider repeat MRI spine, orthopedic/neurosurgery referral, and physical therapy  - CMP today, follow up as needed

## 2023-05-21 NOTE — Assessment & Plan Note (Signed)
 A1c 6.4% 04/20/2022. Patient continues to stay active by walking on a treadmill 2-3 times a week. Will continue to monitor A1c given chronic use prednisone for MG.  Plan: - Repeat A1c today, follow up as needed

## 2023-05-21 NOTE — Progress Notes (Signed)
 CC: Routine follow-up of chronic medical conditions.  HPI:  Mr.Ervie H Deterding is a 53 y.o. male living with a history stated below and presents today for routine follow-up of his chronic medical conditions. Please see problem-based assessment and plan for additional details.  Past Medical History:  Diagnosis Date   Arthritis    Asthma    childhood issue   Atrial fibrillation, transient (HCC)    aflutter 10/2010   Dysrhythmia    History of prediabetes    Myasthenia gravis (HCC) 12/2010   Pre-diabetes    per pt report   Thyroid  disease    hyperthyroidism, s/p RAI    Current Outpatient Medications on File Prior to Visit  Medication Sig Dispense Refill   CALCIUM-VITAMIN D PO Take 1 tablet by mouth daily.     multivitamin-lutein  (OCUVITE-LUTEIN ) CAPS capsule Take 1 capsule by mouth daily. (Patient not taking: Reported on 03/13/2022)     omeprazole (PRILOSEC) 20 MG capsule Take 20 mg by mouth daily.     oxyCODONE -acetaminophen  (PERCOCET) 10-325 MG tablet Take 1 tablet by mouth every 6 (six) hours as needed for pain.     predniSONE  (DELTASONE ) 10 MG tablet Take 1 tablet (10 mg total) by mouth daily with breakfast. 30 tablet 0   pyridostigmine  (MESTINON ) 60 MG tablet TAKE 1 TABLET BY MOUTH 3 TIMES DAILY. 90 tablet 0   sildenafil  (VIAGRA ) 100 MG tablet TAKE 1 TABLET BY MOUTH EVERY DAY AS NEEDED FOR ERECTILE DYSFUNCTION 20 tablet 1   No current facility-administered medications on file prior to visit.    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   Right-handed.   Three cups caffeine per day.   Social Drivers of Corporate Investment Banker Strain: Not on File (08/24/2021)   [facility]   [employer]    Financial Resource Strain: 0  Food Insecurity: Not on File (08/24/2021)   [place] from [place], [place]   Food Insecurity    Food: 0  Transportation Needs: Not on File (08/24/2021)   Received from [employer], [employer] Needs    Transportation: 0  Physical Activity: Not on File (08/24/2021)   Received from [place], [place]   Physical Activity    Physical Activity: 0  Stress: Not on File (08/24/2021)   Received from  [facility], [place]   Stress    Stress: 0  Social Connections: Not on File (08/24/2021)   Received from [place], [place]   Social Connections    Social Connections and Isolation: 0  Intimate Partner Violence: Not on file   Review of Systems: ROS negative except for what is noted on the assessment and plan.  Vitals:   05/21/23 0922  BP: 127/74  Pulse: 76  Temp: 98.5 F (36.9 C)  TempSrc: Oral  SpO2: 100%  Height: 6' 1 (1.854 m)   Physical Exam: Constitutional: well-appearing male, sitting comfortably, in no acute distress HENT: normocephalic atraumatic, mucous membranes moist Eyes: conjunctiva non-erythematous Cardiovascular: regular rate and rhythm, no m/r/g Pulmonary/Chest: normal work of breathing on room air, lungs clear to auscultation bilaterally Abdominal: soft, non-tender, non-distended MSK: normal bulk and tone, gait WNL, bilateral strength  LE WNL, intact sensation, moving all limbs spontaneously, no tenderness on palpation along spine Neurological: alert & oriented, no focal deficits observed Skin: warm and dry Psych: normal mood and behavior  Assessment & Plan:   Patient discussed with Dr. Forest  Myasthenia gravis without exacerbation [facility]) Patient with no symptoms of weakness, fatigue, heart palpitations, or vision changes. Follows with Dr. Agustin, last seen November 2023. Still taking daily prednisone  and pyridostigmine , tolerating well. Recommended calling Dr. Georgianne office to set up regular follow up appointments again.  Plan:  - Continue prednisone  10 mg daily  - Continue pyridostigmine  60 mg TID - Patient to call Dr. Georgianne office to set up follow up appointment   Prediabetes A1c 6.4% 04/20/2022. Patient continues to stay active by walking on a treadmill 2-3 times a week. Will continue to monitor A1c given chronic use prednisone  for MG.  Plan: - Repeat A1c today, follow up as needed  Healthcare maintenance Patient completed screening colonoscopy November 2023, next one in 10 years.   Erectile dysfunction Patient is taking sildenafil  50 mg as needed, tolerating well, no concerns today. No refills needed at this time.   Status post lumbar spine surgery for decompression of spinal cord Patient with history of lumbar spinal surgery with decompression of spinal cord for large synovial cyst on L4-5 projecting into the spinal canal with Dr. Barbarann (orthopedic surgery). Patient on chronic pain medication management including oxycodone /acetaminophen  (percocet) q6H as needed that is refilled by [facility] pain management clinic.   Patient states he has resumed walking on treadmill a few times a week since the New Year (about 2 weeks ago). Since then, he has had tightening/discomfort in his lower back as well as associated right groin pain that worsens with activity and improves with rest. Has been taking Percocet and hot  showers with some relief. Denied red flag symptoms such as numbness, weakness, urinary or fecal incontinence, fevers, or pain that wakes him up from his sleep. States the pain is starting to affect the way he walks. Physical exam unremarkable, no point tenderness along spine with palpation. Discussed conservative management of symptoms with 1 month follow up. Patient is agreeable to plan, will call clinic back to schedule an appointment sooner if needed.  Plan:  - Robaxin  500 mg q6H  - Stretching prior to exercising - Warm compresses - OTC lidocaine  patches - 1- month follow up as needed; if pain is worse or patient fails conservative management will consider repeat MRI spine, orthopedic/neurosurgery referral, and physical therapy  - CMP today, follow up as needed  Nakiah Osgood Arellano Zameza, MD [facility] Internal Medicine, PGY-1 Phone: [phone] Date 05/21/2023 Time 1:15  PM

## 2023-05-21 NOTE — Patient Instructions (Addendum)
 Thank you, Mr.Russell Green for allowing us  to provide your care today. Today we discussed your medications and chronic medical problems.    I have ordered the following labs for you:  Lab Orders         Hemoglobin A1c         CMP14 + Anion Gap       Tests ordered today:  None  Referrals ordered today:   Referral Orders  No referral(s) requested today     I have ordered the following medication/changed the following medications:   Stop the following medications: Medications Discontinued During This Encounter  Medication Reason   methocarbamol  (ROBAXIN ) 500 MG tablet Reorder     Start the following medications: Meds ordered this encounter  Medications   methocarbamol  (ROBAXIN ) 500 MG tablet    Sig: Take 1 tablet (500 mg total) by mouth every 6 (six) hours as needed for muscle spasms.    Dispense:  60 tablet    Refill:  0     Follow up: 6 months (or 1 month if back pain does not improve with conservative management)    Remember:   - Please be sure to continue to follow up regularly with neurology for management of your myasthenia gravis and medications - I will call you with any remaining lab results (A1c and Complete metabolic panel)  - For your back pain you can take Robaxin  500 mg every 6 hours as needed for muscle spasms, stretches, over the counter lidocaine  patches and warm compresses on your lower back. If your pain worsens or does not improve with this plan please call our office so we can do a 1 month follow up and determine if MRI imaging and orthopedic referral is needed at that time.  - Be sure to call our clinic at the number listed below if you need any refills or need to be seen before your 6 month visit  Should you have any questions or concerns please call the internal medicine clinic at [phone].    Russell Schultes Arellano Zameza, MD PGY-1 Internal Medicine Teaching [facility]

## 2023-05-21 NOTE — Assessment & Plan Note (Signed)
 Patient with no symptoms of weakness, fatigue, heart palpitations, or vision changes. Follows with Dr. Agustin, last seen November 2023. Still taking daily prednisone  and pyridostigmine , tolerating well. Recommended calling Dr. Georgianne office to set up regular follow up appointments again.  Plan:  - Continue prednisone  10 mg daily  - Continue pyridostigmine  60 mg TID - Patient to call Dr. Georgianne office to set up follow up appointment

## 2023-05-21 NOTE — Assessment & Plan Note (Signed)
 Patient completed screening colonoscopy November 2023, next one in 10 years.

## 2023-05-21 NOTE — Assessment & Plan Note (Signed)
 Patient is taking sildenafil 50 mg as needed, tolerating well, no concerns today. No refills needed at this time.

## 2023-05-22 LAB — CMP14 + ANION GAP
ALT: 20 [IU]/L (ref 0–44)
AST: 22 [IU]/L (ref 0–40)
Albumin: 3.9 g/dL (ref 3.8–4.9)
Alkaline Phosphatase: 52 [IU]/L (ref 44–121)
Anion Gap: 14 mmol/L (ref 10.0–18.0)
BUN/Creatinine Ratio: 12 (ref 9–20)
BUN: 12 mg/dL (ref 6–24)
Bilirubin Total: 0.5 mg/dL (ref 0.0–1.2)
CO2: 25 mmol/L (ref 20–29)
Calcium: 9.1 mg/dL (ref 8.7–10.2)
Chloride: 102 mmol/L (ref 96–106)
Creatinine, Ser: 1.04 mg/dL (ref 0.76–1.27)
Globulin, Total: 2.1 g/dL (ref 1.5–4.5)
Glucose: 79 mg/dL (ref 70–99)
Potassium: 4 mmol/L (ref 3.5–5.2)
Sodium: 141 mmol/L (ref 134–144)
Total Protein: 6 g/dL (ref 6.0–8.5)
eGFR: 87 mL/min/{1.73_m2} (ref >59)

## 2023-05-22 LAB — HEMOGLOBIN A1C
Est. average glucose Bld gHb Est-mCnc: 134 mg/dL
Hgb A1c MFr Bld: 6.3 % — ABNORMAL HIGH (ref 4.8–5.6)

## 2023-05-23 NOTE — Progress Notes (Signed)
 Internal Medicine Clinic Attending  Case discussed with the resident at the time of the visit.  We reviewed the resident's history and exam and pertinent patient test results.  I agree with the assessment, diagnosis, and plan of care documented in the resident's note.

## 2023-05-27 ENCOUNTER — Other Ambulatory Visit: Payer: Self-pay | Admitting: Neurology

## 2023-05-27 ENCOUNTER — Telehealth: Payer: Self-pay

## 2023-05-27 DIAGNOSIS — G7000 Myasthenia gravis without (acute) exacerbation: Secondary | ICD-10-CM

## 2023-05-27 NOTE — Telephone Encounter (Signed)
Call to patient, no answer. LVM that appointment needed for futire refills. Asked to call office and schedule visit.

## 2023-05-31 ENCOUNTER — Other Ambulatory Visit: Payer: Self-pay | Admitting: Neurology

## 2023-05-31 DIAGNOSIS — G7000 Myasthenia gravis without (acute) exacerbation: Secondary | ICD-10-CM

## 2023-06-26 ENCOUNTER — Other Ambulatory Visit: Payer: Self-pay | Admitting: Neurology

## 2023-06-26 DIAGNOSIS — G7000 Myasthenia gravis without (acute) exacerbation: Secondary | ICD-10-CM

## 2023-06-30 ENCOUNTER — Other Ambulatory Visit: Payer: Self-pay | Admitting: Neurology

## 2023-06-30 DIAGNOSIS — G7000 Myasthenia gravis without (acute) exacerbation: Secondary | ICD-10-CM

## 2023-07-31 ENCOUNTER — Other Ambulatory Visit: Payer: Self-pay | Admitting: Neurology

## 2023-07-31 DIAGNOSIS — G7000 Myasthenia gravis without (acute) exacerbation: Secondary | ICD-10-CM

## 2023-08-02 ENCOUNTER — Other Ambulatory Visit: Payer: Self-pay | Admitting: Neurology

## 2023-08-02 NOTE — Telephone Encounter (Signed)
 Last seen on 03/05/22  Rx was prescribed by Atway, Rayann N, DO.

## 2023-09-02 ENCOUNTER — Other Ambulatory Visit: Payer: Self-pay | Admitting: Neurology

## 2023-09-02 DIAGNOSIS — G7000 Myasthenia gravis without (acute) exacerbation: Secondary | ICD-10-CM

## 2023-09-02 MED ORDER — PYRIDOSTIGMINE BROMIDE 60 MG PO TABS: 60.0000 mg | ORAL_TABLET | Freq: Three times a day (TID) | ORAL | 0 refills | Status: DC

## 2023-09-02 MED ORDER — PREDNISONE 10 MG PO TABS: 10.0000 mg | ORAL_TABLET | Freq: Every day | ORAL | 0 refills | Status: DC

## 2023-09-17 ENCOUNTER — Encounter (HOSPITAL_COMMUNITY): Payer: Self-pay

## 2023-09-17 ENCOUNTER — Ambulatory Visit (HOSPITAL_COMMUNITY)
Admission: EM | Admit: 2023-09-17 | Discharge: 2023-09-17 | Disposition: A | Discharge status: Home / Self Care | Payer: Self-pay | Attending: Family Medicine | Admitting: Family Medicine

## 2023-09-17 DIAGNOSIS — R110 Nausea: Secondary | ICD-10-CM

## 2023-09-17 DIAGNOSIS — R051 Acute cough: Secondary | ICD-10-CM

## 2023-09-17 DIAGNOSIS — R5383 Other fatigue: Secondary | ICD-10-CM

## 2023-09-17 DIAGNOSIS — R195 Other fecal abnormalities: Secondary | ICD-10-CM

## 2023-09-17 LAB — POC COVID19/FLU A&B COMBO
Covid Antigen, POC: NEGATIVE
Influenza A Antigen, POC: NEGATIVE
Influenza B Antigen, POC: NEGATIVE

## 2023-09-17 MED ORDER — ONDANSETRON 4 MG PO TBDP: 4.0000 mg | ORAL_TABLET | Freq: Three times a day (TID) | ORAL | 0 refills | Status: DC | PRN

## 2023-09-17 NOTE — ED Triage Notes (Signed)
 Pt c/o not feeling well since Friday. C/o slight cough, fatigue, nausea, chills, and stomach ache. States same sx's when he had COVID years ago.

## 2023-09-17 NOTE — ED Provider Notes (Signed)
 [facility]   [id number] 09/17/23 Arrival Time: 1333  ASSESSMENT & PLAN:  1. Nausea without vomiting   2. Loose stools   3. Other fatigue   4. Acute cough    Discussed typical duration of likely viral illness. Family members with similar.  Results for orders placed or performed during the hospital encounter of 09/17/23  POC Covid19/Flu A&B Antigen   Collection Time: 09/17/23  2:37 PM  Result Value Ref Range   Influenza A Antigen, POC Negative Negative   Influenza B Antigen, POC Negative Negative   Covid Antigen, POC Negative Negative   OTC symptom care as needed.  Meds ordered this encounter  Medications   ondansetron  (ZOFRAN -ODT) 4 MG disintegrating tablet    Sig: Take 1 tablet (4 mg total) by mouth every 8 (eight) hours as needed for nausea or vomiting.    Dispense:  15 tablet    Refill:  0     Follow-up Information     Driscilla George, MD.   Specialty: Internal Medicine Why: As needed. Contact information: [address], [address] [place] [address] [phone]         [facility] [place].   Specialty: Urgent Care Why: If worsening or failing to improve as anticipated. Contact information: [address] [place]  [address] [phone]                Reviewed expectations re: course of current medical issues. Questions answered. Outlined signs and symptoms indicating need for more acute intervention. Understanding verbalized. After Visit Summary given.   SUBJECTIVE: History from: Patient. Russell Green is a 53 y.o. male. Pt c/o not feeling well since Friday. C/o slight cough, fatigue, nausea, chills, and stomach ache. States same sx's when he had COVID years ago.  Denies: fever and difficulty breathing. Decreased appetite.  OBJECTIVE:  Vitals:   09/17/23 1400  BP: 136/84  Pulse: 80  Resp: 18  Temp: 98.4 F (36.9 C)  TempSrc: Oral  SpO2: 96%    General appearance: alert; no  distress Eyes: PERRLA; EOMI; conjunctiva normal HENT: [place]; AT; with mild nasal congestion Neck: supple  Abd: soft/NT Lungs: speaks full sentences without difficulty; unlabored; CTAB Extremities: no edema Skin: warm and dry Neurologic: normal gait Psychological: alert and cooperative; normal mood and affect  Labs: Results for orders placed or performed during the hospital encounter of 09/17/23  POC Covid19/Flu A&B Antigen   Collection Time: 09/17/23  2:37 PM  Result Value Ref Range   Influenza A Antigen, POC Negative Negative   Influenza B Antigen, POC Negative Negative   Covid Antigen, POC Negative Negative   Labs Reviewed  POC COVID19/FLU A&B COMBO    Imaging: No results found.  Allergies  Allergen Reactions   Flexeril [Cyclobenzaprine] Other (See Comments)    Restlessness    Past Medical History:  Diagnosis Date   Arthritis    Asthma    childhood issue   Atrial fibrillation, transient (HCC)    aflutter 10/2010   Dysrhythmia    History of prediabetes    Myasthenia gravis (HCC) 12/2010   Pre-diabetes    per pt report   Thyroid  disease    hyperthyroidism, s/p RAI   Social History   Socioeconomic History   Marital status: Married    Spouse name: Not on file   Number of children: 0   Years of education: GED   Highest education level: Not on file  Occupational History  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   Right-handed.   Three cups caffeine per day.   Social Drivers  of Corporate investment banker Strain: Not on File (08/24/2021)   [facility] [employer] [facility] [employer] [facility]   [employer]    Financial Resource Strain: 0  Food Insecurity: Not on File (08/24/2021)   Received from [place], [place]   Food Insecurity    Food: 0  Transportation Needs: Not on File (08/24/2021)   Received from [employer], [employer] Needs    Transportation: 0  Physical Activity: Not on File (08/24/2021)   Received from [place], [place]   Physical Activity    Physical Activity: 0  Stress: Not on File (08/24/2021)   Received from [facility] [place], [place]   Stress    Stress: 0  Social Connections: Not on File (08/24/2021)   Received from [place], [place]   Social Connections    Social Connections and Isolation: 0  Intimate Partner Violence: Not on file   Family History  Problem Relation Age of Onset   Cancer Mother        unsure of type   Other Father        unsure of medical history   Healthy Sister    Healthy Brother    Past Surgical History:  Procedure Laterality Date   A-FLUTTER ABLATION     LUMBAR LAMINECTOMY/DECOMPRESSION MICRODISCECTOMY N/A 01/15/2022   Procedure: L4-5 LUMBAR DECOMPRESSION, REMOVAL OF L4-5 FACET CYST;  Surgeon: Adah Acron, MD;  Location: [facility] OR;  Service: Orthopedics;  Laterality: N/A;   TRACHEAL SURGERY  2012   WISDOM TOOTH EXTRACTION       Afton Albright, MD 09/17/23 1447

## 2023-09-17 NOTE — Discharge Instructions (Addendum)
 Please do your best to ensure adequate fluid intake in order to avoid dehydration. If you find that you are unable to tolerate drinking fluids regularly please proceed to the Emergency Department for evaluation.  Results for orders placed or performed during the hospital encounter of 09/17/23  POC Covid19/Flu A&B Antigen   Collection Time: 09/17/23  2:37 PM  Result Value Ref Range   Influenza A Antigen, POC Negative Negative   Influenza B Antigen, POC Negative Negative   Covid Antigen, POC Negative Negative

## 2023-09-19 ENCOUNTER — Encounter: Payer: Self-pay | Admitting: Neurology

## 2023-09-19 ENCOUNTER — Ambulatory Visit (INDEPENDENT_AMBULATORY_CARE_PROVIDER_SITE_OTHER): Payer: Self-pay | Admitting: Neurology

## 2023-09-19 VITALS — BP 117/76 | HR 66 | Ht 73.0 in | Wt 256.0 lb

## 2023-09-19 DIAGNOSIS — G7000 Myasthenia gravis without (acute) exacerbation: Secondary | ICD-10-CM

## 2023-09-19 DIAGNOSIS — H532 Diplopia: Secondary | ICD-10-CM

## 2023-09-19 MED ORDER — PREDNISONE 10 MG PO TABS: 10.0000 mg | ORAL_TABLET | Freq: Every day | ORAL | 11 refills | Status: DC

## 2023-09-19 MED ORDER — PYRIDOSTIGMINE BROMIDE 60 MG PO TABS
60.0000 mg | ORAL_TABLET | Freq: Three times a day (TID) | ORAL | 3 refills | Status: DC
Start: 2023-09-19 — End: 2023-09-19

## 2023-09-19 MED ORDER — PREDNISONE 5 MG PO TABS: 10.0000 mg | ORAL_TABLET | Freq: Every day | ORAL | 3 refills | Status: DC

## 2023-09-19 MED ORDER — PYRIDOSTIGMINE BROMIDE 60 MG PO TABS: 60.0000 mg | ORAL_TABLET | Freq: Three times a day (TID) | ORAL | 3 refills | Status: AC

## 2023-09-19 MED ORDER — PREDNISONE 5 MG PO TABS: 10.0000 mg | ORAL_TABLET | Freq: Every day | ORAL | 3 refills | Status: AC

## 2023-09-19 NOTE — Progress Notes (Signed)
 Chief Complaint  Patient presents with   Myasthenia Gravis    RM 15, ALONE, Myasthenia Gravis: pt states well controlled, pt reports no changes, no weakness.     ASSESSMENT AND PLAN  Russell Green is a 53 y.o. male   Seronegative generalized myasthenia gravis  Previous significant bulbar, ocular, limb muscle weakness, required prolonged intubation, tracheostomy in 2012, was treated by [facility] Dr. Gwen Lek  Significant improvement following plasma exchange,  CT chest that showed no thymus pathology  From patient and record, there is no evidence of using IVIG, and steroid sparing agent treatment in the past,   Has been on long-term prednisone  treatment, 20 mg / 10 mg recently, still require Mestinon  60 mg up to 3 times a day for recurrent double vision,   Overall doing well, refill his mestinon  60mg  tid prn, keep low prednisone , may consider tapering down from 10mg  to 7.5mg  daily.  Return To Clinic With NP In 12 Months He is selfpay,  DIAGNOSTIC DATA (LABS, IMAGING, TESTING) - I reviewed patient records, labs, notes, testing and imaging myself where available. Laboratory evaluation in 2022, normal CMP, CBC, HIV, TSH, A1c, lipid panel, negative acetylcholine binding antibody,  HISTORICAL  Russell Green is a 53 year old male, seen in request by his primary care physician Dr. Sandra Crouch for evaluation of myasthenia gravis, initial evaluation was on July 06, 2020  I reviewed and summarized the referring note.  He began to develop symptoms around 2010, described difficulty chewing, significant weight loss, double vision, voice change, eventually went into respiratory failure about 18 months after his symptom onset, while he was incarcerated in June 2012, he was treated at [facility], required prolonged intubation, tracheostomy, was diagnosed with myasthenia gravis, he was under the care of Dr. Kennard Pea team, I have limited access to 2012 initial evaluation, per patient, his symptoms  began to gradually improve following 1 round of plasma exchange  He was put on high-dose of prednisone  60 mg along with Mestinon  60 mg 3 times daily, there was discussion of long-term steroid sparing agent such as CellCept and Imuran, he worried about the side effect, also because of his social situation, he has been kept on prednisone  since it started in 2012  Per patient, over the years, attempt to lower dose of prednisone  below 30 mg, or lower Mestinon  dose below 30 mg 3 times daily, he would have recurrent symptoms such as double vision, chewing difficulty, increased fatigue, lack of stamina, limb muscle weakness  Last visit with Dr. Gwen Lek was in January 2019, reported suboptimal control of his myasthenia symptoms, exertional fatigue, limit his ability to perform tasks around [place], climb stairs, daily double vision, while taking prednisone  30 mg daily, Mestinon  60 mg 3 times a day, Multiple spirometric trials are performed. On best effort the following are obtained: FVC: 3.21L, FEV1: 2.50L, FEV1%: 78%. The interpretation is of moderate restriction.   Following with Dr. Tommye Franc retirement, he was followed by Dr.Anahit Cheluskin Mehrabyan, most recent visit was in November 2020  SINGLE FIBER ELECTROMYOGRAPHY was performed on the left Extensor Digitorum in 2019. The study was performed 72 hours following his last dose of pyridostigmine  and while taking daily prednisone . Seventeen fiber pairs were examined and the MCD was 26.9 microSec. The Median MCD was 27.8 microSec. Normal jitter was seen in 17 of 17 (100%) pairs, increased jitter without impulse blocking was seen in 0 of 17 (0%) pairs, and impulse blocking was not observed (0%).    Single Fiber  EMG recordings demonstrate increased MCD and percent fiberpairs with increased jitter and blocking in the EDC muscle only.  The frontalis muscle is normal.   The conclusion was, these electrodiagnostic findings continue to demonstrate anabnormality  of neuromuscular transmission in the EDC muscle.  Quantitatively there has been slight worsening in the EDC muscle while the Frontalis muscleis not changed.      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, assistant patient, with repetitive movement, he continues to complain shortness of breath, exertion, muscle weakness, at the end of the day, usually developed double vision especially looking to the left side, denies chewing difficulty, but try to avoid tough meat,  Laboratory evaluations in 2022, lipid panel, elevated LDL 109, cholesterol 218, PSA 1.1, normal TSH 2.0, negative HIV, hepatitis A, B, C, hepatitis B surface antigen was nonreactive, antibody was more than 1 solvent, A1c was 6.3, normal liver functional test, creatinine 1.28, hemoglobin 15,  Per record, previous multiple CT chest that showed no thymus pathology  UPDATE Feb 17 2021: He has continued on prednisone  30 mg daily, previous discussion about steroid sparing agent, such as CellCept, Imuran, he worries about the potential side effect, also continued on Mestinon  60 mg 3 times a day, last dose was before bedtime, he denies difficulty sleeping, sleeps on 1 pillow, no shortness of breath during sleep, but has frequent snoring  He continue complains of intermittent weakness, fatigue, he is patient aid, sometimes he has difficulty helping patient, at the end of the day he often has intermittent double vision, extreme fatigue, Denies swallowing difficulty, but complains of frequent shortness of breath, sometimes he felt like difficulty to get air in  UPDATE Mar 05 2022: He has lost follow up since Oct 2022, now on mestion 60mg  bid, instead of tid, he noticed blurry vision, double vision, no chewing swallowing difficulty, occasionally, he felt mild SOB after heavy lifting, now prednisone  20mg  daily, since August 2023, was on prednsione 20mg /10mg s.  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 Tommy Frames  on January 15, 2022 for severe right lumbar radicular pain,   He walks on treadmill 2 miles every other day.  I personally reviewed MRI lumbar on December 12 2021.  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 In addition there is moderate foraminal encroachment bilaterally due to spurring at L4-5.  Laboratory evaluations, October 2022, negative acetylcholine panel, negative TB skin test, hepatitis ABC, HIV, MuSK antibody,  A1c January 03, 2022 6.7, normal CBC, BMP with exception of elevated creatinine 1.48  UPDATE May 15th 2025: He works home health, continue to have intermittent blurry vision, With a diagnosis of seronegative myasthenia gravis, on prednisone  10 mg daily, Mestinon  60 mg 3 times a day as needed, denies significant bulbar or limb muscle weakness, wants refill  Lab in 2025: normal CMP, A1c 6.3   PHYSICAL EXAM   Vitals:   09/19/23 1608  Weight: 256 lb (116.1 kg)  Height: 6\' 1"  (1.854 m)      Body mass index is 34.17 kg/m.  PHYSICAL EXAMNIATION:  Gen: NAD, conversant, well nourised, well groomed  Cardiovascular: Regular rate rhythm, no peripheral edema, warm, nontender. Eyes: Conjunctivae clear without exudates or hemorrhage Neck: Supple, no carotid bruits. Pulmonary: Clear to auscultation bilaterally   NEUROLOGICAL EXAM:  MENTAL STATUS: Speech/cognition: Awake, alert, oriented to history taking care of conversation   CRANIAL NERVES: CN II: Visual fields are full to confrontation. Pupils are round equal and briskly reactive to light. CN III, IV, VI: extraocular movement are normal. No ptosis.  Cover and uncover testing showed no significant abnormality CN V: Facial sensation is intact  to light touch CN VII: No significant eye closure Gobert puff weakness CN VIII: Hearing is normal to causal conversation. CN IX, X: Phonation is normal. CN XI: Head turning and shoulder shrug are intact  MOTOR: Normal limb muscle strength, no neck flexion weakness  REFLEXES: Reflexes are 1 and symmetric at the biceps, triceps, knees, and ankles. Plantar responses are flexor.  SENSORY: Intact to light touch, pinprick and vibratory sensation are intact in fingers and toes.  COORDINATION: There is no trunk or limb dysmetria noted.  GAIT/STANCE: Get up from low squatting down position without difficulty  REVIEW OF SYSTEMS: Full 14 system review of systems performed and notable only for as above All other review of systems were negative.  ALLERGIES: Allergies  Allergen Reactions   Flexeril [Cyclobenzaprine] Other (See Comments)    Restlessness    HOME MEDICATIONS: Current Outpatient Medications  Medication Sig Dispense Refill   CALCIUM-VITAMIN D PO Take 1 tablet by mouth daily.     omeprazole (PRILOSEC) 20 MG capsule Take 20 mg by mouth daily.     oxyCODONE -acetaminophen  (PERCOCET) 10-325 MG tablet Take 1 tablet by mouth every 6 (six) hours as needed for pain.     predniSONE  (DELTASONE ) 10 MG tablet Take 1 tablet (10 mg total) by mouth daily with breakfast. 30 tablet 0   pyridostigmine  (MESTINON ) 60 MG tablet Take 1 tablet (60 mg total) by mouth 3 (three) times daily. 90 tablet 0   methocarbamol  (ROBAXIN ) 500 MG tablet Take 1 tablet (500 mg total) by mouth every 6 (six) hours as needed for muscle spasms. (Patient not taking: Reported on 09/17/2023) 60 tablet 0   multivitamin-lutein  (OCUVITE-LUTEIN ) CAPS capsule Take 1 capsule by mouth daily. (Patient not taking: Reported on 03/13/2022)     ondansetron  (ZOFRAN -ODT) 4 MG disintegrating tablet Take 1 tablet (4 mg total) by mouth every 8 (eight) hours as needed for nausea or vomiting. (Patient not taking: Reported on 09/19/2023) 15 tablet 0    sildenafil  (VIAGRA ) 100 MG tablet TAKE 1 TABLET BY MOUTH EVERY DAY AS NEEDED FOR ERECTILE DYSFUNCTION (Patient not taking: Reported on 09/19/2023) 20 tablet 1   No current facility-administered medications for this visit.    PAST MEDICAL HISTORY: Past Medical History:  Diagnosis Date   Arthritis    Asthma    childhood issue   Atrial fibrillation, transient (HCC)    aflutter 10/2010   Dysrhythmia    History of prediabetes    Myasthenia gravis (HCC) 12/2010   Pre-diabetes    per pt report   Thyroid  disease    hyperthyroidism, s/p RAI    PAST SURGICAL HISTORY: Past Surgical History:  Procedure Laterality Date   A-FLUTTER ABLATION     LUMBAR LAMINECTOMY/DECOMPRESSION MICRODISCECTOMY N/A 01/15/2022   Procedure: L4-5 LUMBAR DECOMPRESSION, REMOVAL OF L4-5 FACET CYST;  Surgeon: Adah Acron, MD;  Location: [facility] OR;  Service: Orthopedics;  Laterality: N/A;   TRACHEAL SURGERY  2012   WISDOM TOOTH EXTRACTION  FAMILY HISTORY: Family History  Problem Relation Age of Onset   Cancer Mother        unsure of type   Other Father        unsure of medical history   Healthy Sister    Healthy Brother     SOCIAL HISTORY: Social History   Socioeconomic History   Marital status: Married    Spouse name: Not on file   Number of children: 0   Years of education: GED   Highest education level: Not on file  Occupational History   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   Right-handed.   Three cups  caffeine per day.   Social Drivers of Corporate investment banker Strain: Not on File (08/24/2021)   [facility] [employer] [employer]   [employer]    Financial Resource Strain: 0  Food Insecurity: Not on File (08/24/2021)   Received from [place], [place]   Food Insecurity    Food: 0  Transportation Needs: Not on File (08/24/2021)   Received from [employer], [employer] Needs    Transportation: 0  Physical Activity: Not on File (08/24/2021)   Received from [place], [place]   Physical Activity    Physical Activity: 0  Stress: Not on File (08/24/2021)   Received from [facility], [place]   Stress    Stress: 0  Social Connections: Not on File (08/24/2021)   Received from [place], [place]   Social Connections    Social Connections and Isolation: 0  Intimate Partner Violence: Not on file      Phebe Brasil, M.D. Ph.D.  [facility] [address], [address] [place], [place] [address] Ph: [phone] Fax: [phone]  CC:  Arcadio Knuckles, MD [address] [place],  [place] [address]

## 2023-11-18 ENCOUNTER — Encounter: Payer: Self-pay | Admitting: *Deleted

## 2024-05-02 ENCOUNTER — Emergency Department (HOSPITAL_COMMUNITY)
Admission: EM | Admit: 2024-05-02 | Discharge: 2024-05-02 | Disposition: A | Discharge status: Home / Self Care | Payer: Self-pay | Attending: Emergency Medicine | Admitting: Emergency Medicine

## 2024-05-02 ENCOUNTER — Encounter (HOSPITAL_COMMUNITY): Payer: Self-pay

## 2024-05-02 ENCOUNTER — Other Ambulatory Visit: Payer: Self-pay

## 2024-05-02 ENCOUNTER — Emergency Department (HOSPITAL_COMMUNITY): Payer: Self-pay

## 2024-05-02 DIAGNOSIS — R197 Diarrhea, unspecified: Secondary | ICD-10-CM | POA: Insufficient documentation

## 2024-05-02 DIAGNOSIS — R000 Tachycardia, unspecified: Secondary | ICD-10-CM | POA: Insufficient documentation

## 2024-05-02 DIAGNOSIS — R0602 Shortness of breath: Secondary | ICD-10-CM | POA: Insufficient documentation

## 2024-05-02 DIAGNOSIS — R112 Nausea with vomiting, unspecified: Secondary | ICD-10-CM | POA: Insufficient documentation

## 2024-05-02 DIAGNOSIS — R079 Chest pain, unspecified: Secondary | ICD-10-CM | POA: Insufficient documentation

## 2024-05-02 DIAGNOSIS — R5383 Other fatigue: Secondary | ICD-10-CM | POA: Insufficient documentation

## 2024-05-02 DIAGNOSIS — R1013 Epigastric pain: Secondary | ICD-10-CM | POA: Insufficient documentation

## 2024-05-02 LAB — COMPREHENSIVE METABOLIC PANEL WITH GFR
ALT: 25 U/L (ref 0–44)
AST: 30 U/L (ref 15–41)
Albumin: 4.5 g/dL (ref 3.5–5.0)
Alkaline Phosphatase: 64 U/L (ref 38–126)
Anion gap: 14 (ref 5–15)
BUN: 20 mg/dL (ref 6–20)
CO2: 25 mmol/L (ref 22–32)
Calcium: 9.1 mg/dL (ref 8.9–10.3)
Chloride: 102 mmol/L (ref 98–111)
Creatinine, Ser: 1.46 mg/dL — ABNORMAL HIGH (ref 0.61–1.24)
GFR, Estimated: 58 mL/min — ABNORMAL LOW
Glucose, Bld: 103 mg/dL — ABNORMAL HIGH (ref 70–99)
Potassium: 3.7 mmol/L (ref 3.5–5.1)
Sodium: 140 mmol/L (ref 135–145)
Total Bilirubin: 0.6 mg/dL (ref 0.0–1.2)
Total Protein: 7.1 g/dL (ref 6.5–8.1)

## 2024-05-02 LAB — RESP PANEL BY RT-PCR (RSV, FLU A&B, COVID)  RVPGX2
Influenza A by PCR: NEGATIVE
Influenza B by PCR: NEGATIVE
Resp Syncytial Virus by PCR: NEGATIVE
SARS Coronavirus 2 by RT PCR: NEGATIVE

## 2024-05-02 LAB — URINALYSIS, ROUTINE W REFLEX MICROSCOPIC
Bilirubin Urine: NEGATIVE
Glucose, UA: NEGATIVE mg/dL
Hgb urine dipstick: NEGATIVE
Ketones, ur: 5 mg/dL — AB
Leukocytes,Ua: NEGATIVE
Nitrite: NEGATIVE
Protein, ur: 30 mg/dL — AB
Specific Gravity, Urine: 1.031 — ABNORMAL HIGH (ref 1.005–1.030)
pH: 5 (ref 5.0–8.0)

## 2024-05-02 LAB — CBC
HCT: 53.1 % — ABNORMAL HIGH (ref 39.0–52.0)
Hemoglobin: 17.4 g/dL — ABNORMAL HIGH (ref 13.0–17.0)
MCH: 29.5 pg (ref 26.0–34.0)
MCHC: 32.8 g/dL (ref 30.0–36.0)
MCV: 90.2 fL (ref 80.0–100.0)
Platelets: 199 K/uL (ref 150–400)
RBC: 5.89 MIL/uL — ABNORMAL HIGH (ref 4.22–5.81)
RDW: 13.2 % (ref 11.5–15.5)
WBC: 7.8 K/uL (ref 4.0–10.5)
nRBC: 0 % (ref 0.0–0.2)

## 2024-05-02 LAB — TSH: TSH: 3.96 u[IU]/mL (ref 0.350–4.500)

## 2024-05-02 LAB — TROPONIN T, HIGH SENSITIVITY: Troponin T High Sensitivity: <15 ng/L (ref 0–19)

## 2024-05-02 LAB — LIPASE, BLOOD: Lipase: 23 U/L (ref 11–51)

## 2024-05-02 MED ORDER — ONDANSETRON 4 MG PO TBDP: 4.0000 mg | ORAL_TABLET | Freq: Three times a day (TID) | ORAL | 0 refills | Status: AC | PRN

## 2024-05-02 MED ORDER — ALUM & MAG HYDROXIDE-SIMETH 200-200-20 MG/5ML PO SUSP
30.0000 mL | Freq: Once | ORAL | Status: AC
  Administered 2024-05-02: 30 mL via ORAL
  Filled 2024-05-02: qty 30

## 2024-05-02 MED ORDER — ONDANSETRON HCL 4 MG/2ML IJ SOLN
4.0000 mg | Freq: Once | INTRAMUSCULAR | Status: AC
  Administered 2024-05-02: 4 mg via INTRAVENOUS
  Filled 2024-05-02: qty 2

## 2024-05-02 MED ORDER — SODIUM CHLORIDE 0.9 % IV BOLUS
1000.0000 mL | Freq: Once | INTRAVENOUS | Status: AC
  Administered 2024-05-02: 1000 mL via INTRAVENOUS

## 2024-05-02 MED ORDER — ACETAMINOPHEN 500 MG PO TABS
1000.0000 mg | ORAL_TABLET | Freq: Once | ORAL | Status: AC
  Administered 2024-05-02: 1000 mg via ORAL
  Filled 2024-05-02: qty 2

## 2024-05-02 MED ORDER — PANTOPRAZOLE SODIUM 40 MG IV SOLR
40.0000 mg | Freq: Once | INTRAVENOUS | Status: AC
  Administered 2024-05-02: 40 mg via INTRAVENOUS
  Filled 2024-05-02: qty 10

## 2024-05-02 MED ORDER — LIDOCAINE VISCOUS HCL 2 % MT SOLN
15.0000 mL | Freq: Once | OROMUCOSAL | Status: AC
  Administered 2024-05-02: 15 mL via ORAL
  Filled 2024-05-02: qty 15

## 2024-05-02 NOTE — Discharge Instructions (Signed)
 Please follow-up closely with your primary care doctor on an outpatient basis.  Return to emergency department immediately for any new or worsening symptoms.  Your symptoms today do appear to be secondary to an acute viral syndrome.

## 2024-05-02 NOTE — ED Notes (Signed)
 Pt unable to urinate at this time.

## 2024-05-02 NOTE — ED Triage Notes (Signed)
 Complaining of abdominal pain and vomiting that started  1 hour ago.

## 2024-05-02 NOTE — ED Notes (Signed)
 Pt given specimen cup.[id number]

## 2024-05-02 NOTE — ED Provider Triage Note (Signed)
 Emergency Medicine Provider Triage Evaluation Note  [name] , a 53 y.o. male  was evaluated in triage.  Pt complains of abdominal pain.  Brought in by EMS.  Patient refuses to answer questions easily  Review of Systems  Positive: Abdominal pain Negative: Fevers, cough, sick contacts, changes in bowel habits  Physical Exam  BP 109/81 (BP Location: Left Arm)   Pulse (!) 118   Temp 98 F (36.7 C) (Oral)   Resp 16   Ht 6' 1 (1.854 m)   Wt 116.1 kg   SpO2 100%   BMI 33.78 kg/m  Gen:   Awake, no distress   Resp:  Normal effort  MSK:   Moves extremities without difficulty  Other:  Generalized TTP abdomen   Medical Decision Making  Medically screening exam initiated at 12:43 AM.  Appropriate orders placed.  Jahlil H Hanf was informed that the remainder of the evaluation will be completed by another provider, this initial triage assessment does not replace that evaluation, and the importance of remaining in the ED until their evaluation is complete.     Beverley Leita LABOR, PA-C 05/02/24 [phone]

## 2024-05-02 NOTE — ED Provider Notes (Signed)
 " [place] [facility] Provider Note   CSN: [id number] Arrival date & time: 05/02/24  0022     Patient presents with: Abdominal Pain   Russell Green is a 52 y.o. male.   Patient is a 53 year old male who presents to the emergency department with a chief complaint of abdominal pain, nausea, vomiting, diarrhea, generalized malaise and fatigue which has been ongoing for approximate the past 2 days.  He does have a history of myasthenia gravis.  Patient notes that he has had some chest pain and shortness of breath.  He notes that the pain is worse in the epigastric region.  He denies any associated dizziness, lightheadedness or syncope.  He has had no melena or hematochezia.   Abdominal Pain Associated symptoms: nausea and vomiting        Prior to Admission medications  Medication Sig Start Date End Date Taking? Authorizing Provider  CALCIUM-VITAMIN D PO Take 1 tablet by mouth daily.    [provider]  methocarbamol  (ROBAXIN ) 500 MG tablet Take 1 tablet (500 mg total) by mouth every 6 (six) hours as needed for muscle spasms. Patient not taking: Reported on 09/17/2023 05/21/23   Arellano Zameza, Priscila, MD  multivitamin-lutein  (OCUVITE-LUTEIN ) CAPS capsule Take 1 capsule by mouth daily. Patient not taking: Reported on 03/13/2022    [provider]  omeprazole (PRILOSEC) 20 MG capsule Take 20 mg by mouth daily.    [provider]  ondansetron  (ZOFRAN -ODT) 4 MG disintegrating tablet Take 1 tablet (4 mg total) by mouth every 8 (eight) hours as needed for nausea or vomiting. Patient not taking: Reported on 09/19/2023 09/17/23   Rolinda Rogue, MD  oxyCODONE -acetaminophen  (PERCOCET) 10-325 MG tablet Take 1 tablet by mouth every 6 (six) hours as needed for pain. 09/07/21   [provider]  predniSONE  (DELTASONE ) 5 MG tablet Take 2 tablets (10 mg total) by mouth daily with breakfast. 09/19/23   Onita Duos, MD  pyridostigmine   (MESTINON ) 60 MG tablet Take 1 tablet (60 mg total) by mouth 3 (three) times daily. 09/19/23   Onita Duos, MD  sildenafil  (VIAGRA ) 100 MG tablet TAKE 1 TABLET BY MOUTH EVERY DAY AS NEEDED FOR ERECTILE DYSFUNCTION Patient not taking: Reported on 09/19/2023 06/26/22   Lovie Clarity, MD    Allergies: Flexeril [cyclobenzaprine]    Review of Systems  Gastrointestinal:  Positive for abdominal pain, nausea and vomiting.  All other systems reviewed and are negative.   Updated Vital Signs BP 107/81   Pulse (!) 116   Temp 98 F (36.7 C) (Oral)   Resp 20   Ht 6' 1 (1.854 m)   Wt 116.1 kg   SpO2 97%   BMI 33.78 kg/m   Physical Exam Vitals and nursing note reviewed.  Constitutional:      General: He is not in acute distress.    Appearance: Normal appearance. He is not ill-appearing.  HENT:     Head: Normocephalic and atraumatic.     Nose: Nose normal.     Mouth/Throat:     Mouth: Mucous membranes are moist.  Eyes:     Extraocular Movements: Extraocular movements intact.     Conjunctiva/sclera: Conjunctivae normal.     Pupils: Pupils are equal, round, and reactive to light.  Cardiovascular:     Rate and Rhythm: Regular rhythm. Tachycardia present.     Pulses: Normal pulses.     Heart sounds: Normal heart sounds.  Pulmonary:     Effort: Pulmonary  effort is normal. No respiratory distress.     Breath sounds: Normal breath sounds. No stridor. No wheezing, rhonchi or rales.  Abdominal:     General: Abdomen is flat. Bowel sounds are normal. There is no distension. There are no signs of injury.     Palpations: Abdomen is soft.     Tenderness: There is abdominal tenderness in the epigastric area.     Hernia: No hernia is present.  Musculoskeletal:        General: Normal range of motion.     Cervical back: Normal range of motion and neck supple.  Skin:    General: Skin is warm and dry.  Neurological:     General: No focal deficit present.     Mental Status: He is alert and oriented  to person, place, and time. Mental status is at baseline.  Psychiatric:        Mood and Affect: Mood normal.        Behavior: Behavior normal.        Thought Content: Thought content normal.        Judgment: Judgment normal.     (all labs ordered are listed, but only abnormal results are displayed) Labs Reviewed  COMPREHENSIVE METABOLIC PANEL WITH GFR - Abnormal; Notable for the following components:      Result Value   Glucose, Bld 103 (*)    Creatinine, Ser 1.46 (*)    GFR, Estimated 58 (*)    All other components within normal limits  CBC - Abnormal; Notable for the following components:   RBC 5.89 (*)    Hemoglobin 17.4 (*)    HCT 53.1 (*)    All other components within normal limits  URINALYSIS, ROUTINE W REFLEX MICROSCOPIC - Abnormal; Notable for the following components:   APPearance HAZY (*)    Specific Gravity, Urine 1.031 (*)    Ketones, ur 5 (*)    Protein, ur 30 (*)    Bacteria, UA RARE (*)    All other components within normal limits  LIPASE, BLOOD  TSH  TROPONIN T, HIGH SENSITIVITY    EKG: None  Radiology: CT ABDOMEN PELVIS WO CONTRAST Result Date: 05/02/2024 EXAM: CT ABDOMEN AND PELVIS WITHOUT CONTRAST 05/02/2024 01:42:10 AM TECHNIQUE: CT of the abdomen and pelvis was performed without the administration of intravenous contrast. Multiplanar reformatted images are provided for review. Automated exposure control, iterative reconstruction, and/or weight-based adjustment of the mA/kV was utilized to reduce the radiation dose to as low as reasonably achievable. COMPARISON: None available. CLINICAL HISTORY: Abdominal pain, acute, nonlocalized FINDINGS: LOWER CHEST: No acute abnormality. LIVER: The liver is unremarkable. GALLBLADDER AND BILE DUCTS: Gallbladder is unremarkable. No biliary ductal dilatation. SPLEEN: No acute abnormality. PANCREAS: No acute abnormality. ADRENAL GLANDS: No acute abnormality. KIDNEYS, URETERS AND BLADDER: No stones in the kidneys or  ureters. No hydronephrosis. No perinephric or periureteral stranding. Urinary bladder is unremarkable. GI AND BOWEL: Appendix is normal. The stomach, small bowel, and large bowel are otherwise unremarkable. There is no bowel obstruction. PERITONEUM AND RETROPERITONEUM: No ascites. No free air. VASCULATURE: Aorta is normal in caliber. LYMPH NODES: No lymphadenopathy. REPRODUCTIVE ORGANS: No acute abnormality. BONES AND SOFT TISSUES: Osseous structures are age appropriate. No acute bone abnormality. No lytic or blastic bone lesion. No focal soft tissue abnormality. IMPRESSION: 1. No acute findings in the abdomen or pelvis. Electronically signed by: Dorethia Molt MD 05/02/2024 01:48 AM EST RP Workstation: [id number]     Procedures   Medications Ordered in the  ED  sodium chloride  0.9 % bolus 1,000 mL (has no administration in time range)  ondansetron  (ZOFRAN ) injection 4 mg (has no administration in time range)  alum & mag hydroxide-simeth (MAALOX/MYLANTA) 200-200-20 MG/5ML suspension 30 mL (has no administration in time range)    And  lidocaine  (XYLOCAINE ) 2 % viscous mouth solution 15 mL (has no administration in time range)  pantoprazole  (PROTONIX ) injection 40 mg (has no administration in time range)                                    Medical Decision Making Amount and/or Complexity of Data Reviewed Labs: ordered. Radiology: ordered.  Risk OTC drugs. Prescription drug management.   This patient presents to the ED for concern of abdominal pain, nausea, vomiting, diarrhea differential diagnosis includes gastroenteritis, acute viral syndrome, pneumonia, acute appendicitis, cholecystitis, small bowel obstruction, diverticulitis, pyelonephritis, kidney stone, pancreatitis, mesenteric ischemia    Additional history obtained:  Additional history obtained from none External records from outside source obtained and reviewed including medical records   Lab Tests:  I Ordered, and  personally interpreted labs.  The pertinent results include: No leukocytosis, no anemia, mild elevation in creatinine, normal electrolytes, normal liver function, unremarkable urinalysis, negative respiratory panel, negative troponin   Imaging Studies ordered:  I ordered imaging studies including chest x-ray, CT scan abdomen and pelvis I independently visualized and interpreted imaging which showed no acute cardiopulmonary process, no acute intra-abdominal surgical process I agree with the radiologist interpretation   Medicines ordered and prescription drug management:  I ordered medication including IV fluids, Tylenol , GI cocktail, Zofran , Protonix  for abdominal pain, nausea, vomiting Reevaluation of the patient after these medicines showed that the patient improved I have reviewed the patients home medicines and have made adjustments as needed   Problem List / ED Course:  Patient is doing better at this time and is stable for discharge home.  Tachycardia has resolved with treatment in the emergency department and patient notes that he does feel much better.  Suspect an acute viral gastroenteritis at this point and will continue symptomatic treatment on outpatient basis.  He has no indication for acute exacerbation of his myasthenia gravis.  Chest x-ray demonstrated no indication for pneumonia.  Respiratory panel was negative.  Blood work was unremarkable as well at this point.  He has no clinical indication for sepsis and do not suspect that admission is warranted.  Close follow-up with PCP was discussed as well as strict turn precautions for any new or worsening symptoms.  Patient voiced understanding and had no additional questions. Patient was fully evaluated by attending physician who is in agreement to plan at this time.    Social Determinants of Health:  None        Final diagnoses:  None    ED Discharge Orders     None          Daralene Lonni JONETTA DEVONNA 05/02/24  1147    Tegeler, Lonni PARAS, MD 05/02/24 1606  "

## 2024-09-21 ENCOUNTER — Ambulatory Visit: Payer: Self-pay | Admitting: Neurology
